# Patient Record
Sex: Female | Born: 1965 | Race: White | Hispanic: No | Marital: Married | Smoking: Never smoker
Health system: Southern US, Community
[De-identification: ages and names within clinical notes are randomized; demographics above are authoritative.]

## PROBLEM LIST (undated history)

## (undated) DIAGNOSIS — E7800 Pure hypercholesterolemia, unspecified: Secondary | ICD-10-CM

## (undated) DIAGNOSIS — I10 Essential (primary) hypertension: Secondary | ICD-10-CM

## (undated) DIAGNOSIS — G4733 Obstructive sleep apnea (adult) (pediatric): Secondary | ICD-10-CM

## (undated) DIAGNOSIS — Z860100 Personal history of colon polyps, unspecified: Secondary | ICD-10-CM

## (undated) DIAGNOSIS — R002 Palpitations: Secondary | ICD-10-CM

## (undated) DIAGNOSIS — Z9889 Other specified postprocedural states: Secondary | ICD-10-CM

## (undated) DIAGNOSIS — R32 Unspecified urinary incontinence: Secondary | ICD-10-CM

## (undated) DIAGNOSIS — R200 Anesthesia of skin: Secondary | ICD-10-CM

## (undated) DIAGNOSIS — R112 Nausea with vomiting, unspecified: Secondary | ICD-10-CM

## (undated) DIAGNOSIS — M1990 Unspecified osteoarthritis, unspecified site: Secondary | ICD-10-CM

## (undated) DIAGNOSIS — D649 Anemia, unspecified: Secondary | ICD-10-CM

## (undated) DIAGNOSIS — K8010 Calculus of gallbladder with chronic cholecystitis without obstruction: Secondary | ICD-10-CM

## (undated) HISTORY — DX: Unspecified urinary incontinence: R32

## (undated) HISTORY — DX: Calculus of gallbladder with chronic cholecystitis without obstruction: K80.10

## (undated) HISTORY — DX: Obstructive sleep apnea (adult) (pediatric): G47.33

## (undated) HISTORY — PX: EYE SURGERY: SHX253

## (undated) HISTORY — DX: Personal history of colon polyps, unspecified: Z86.0100

## (undated) HISTORY — DX: Palpitations: R00.2

## (undated) HISTORY — DX: Pure hypercholesterolemia, unspecified: E78.00

## (undated) HISTORY — PX: TRANSTHORACIC ECHOCARDIOGRAM: SHX275

## (undated) HISTORY — PX: DOBUTAMINE STRESS ECHO: SHX5426

## (undated) HISTORY — PX: OTHER SURGICAL HISTORY: SHX169

## (undated) HISTORY — PX: DILATION AND CURETTAGE OF UTERUS: SHX78

---

## 1999-05-31 ENCOUNTER — Other Ambulatory Visit: Admission: RE | Admit: 1999-05-31 | Discharge: 1999-05-31 | Payer: Self-pay | Admitting: Obstetrics & Gynecology

## 1999-10-05 ENCOUNTER — Other Ambulatory Visit: Admission: RE | Admit: 1999-10-05 | Discharge: 1999-10-05 | Payer: Self-pay | Admitting: Obstetrics & Gynecology

## 2000-05-16 ENCOUNTER — Other Ambulatory Visit: Admission: RE | Admit: 2000-05-16 | Discharge: 2000-05-16 | Payer: Self-pay | Admitting: Family Medicine

## 2000-08-07 ENCOUNTER — Other Ambulatory Visit: Admission: RE | Admit: 2000-08-07 | Discharge: 2000-08-07 | Payer: Self-pay | Admitting: Obstetrics & Gynecology

## 2000-08-22 ENCOUNTER — Encounter (INDEPENDENT_AMBULATORY_CARE_PROVIDER_SITE_OTHER): Payer: Self-pay | Admitting: Specialist

## 2000-08-22 ENCOUNTER — Other Ambulatory Visit: Admission: RE | Admit: 2000-08-22 | Discharge: 2000-08-22 | Payer: Self-pay | Admitting: Obstetrics & Gynecology

## 2000-11-28 ENCOUNTER — Other Ambulatory Visit: Admission: RE | Admit: 2000-11-28 | Discharge: 2000-11-28 | Payer: Self-pay | Admitting: Obstetrics & Gynecology

## 2000-11-29 ENCOUNTER — Other Ambulatory Visit: Admission: RE | Admit: 2000-11-29 | Discharge: 2000-11-29 | Payer: Self-pay | Admitting: Obstetrics & Gynecology

## 2000-11-29 ENCOUNTER — Encounter (INDEPENDENT_AMBULATORY_CARE_PROVIDER_SITE_OTHER): Payer: Self-pay | Admitting: Specialist

## 2001-02-27 ENCOUNTER — Other Ambulatory Visit: Admission: RE | Admit: 2001-02-27 | Discharge: 2001-02-27 | Payer: Self-pay | Admitting: Obstetrics & Gynecology

## 2001-09-03 ENCOUNTER — Other Ambulatory Visit: Admission: RE | Admit: 2001-09-03 | Discharge: 2001-09-03 | Payer: Self-pay | Admitting: Obstetrics & Gynecology

## 2002-01-21 ENCOUNTER — Other Ambulatory Visit: Admission: RE | Admit: 2002-01-21 | Discharge: 2002-01-21 | Payer: Self-pay | Admitting: Obstetrics & Gynecology

## 2002-09-18 ENCOUNTER — Other Ambulatory Visit: Admission: RE | Admit: 2002-09-18 | Discharge: 2002-09-18 | Payer: Self-pay | Admitting: Obstetrics & Gynecology

## 2003-02-27 ENCOUNTER — Other Ambulatory Visit: Admission: RE | Admit: 2003-02-27 | Discharge: 2003-02-27 | Payer: Self-pay | Admitting: Obstetrics & Gynecology

## 2003-10-06 ENCOUNTER — Other Ambulatory Visit: Admission: RE | Admit: 2003-10-06 | Discharge: 2003-10-06 | Payer: Self-pay | Admitting: Obstetrics & Gynecology

## 2004-11-26 ENCOUNTER — Other Ambulatory Visit: Admission: RE | Admit: 2004-11-26 | Discharge: 2004-11-26 | Payer: Self-pay | Admitting: Obstetrics & Gynecology

## 2005-01-18 ENCOUNTER — Emergency Department (HOSPITAL_COMMUNITY): Admission: EM | Admit: 2005-01-18 | Discharge: 2005-01-18 | Payer: Self-pay | Admitting: *Deleted

## 2005-12-23 ENCOUNTER — Other Ambulatory Visit: Admission: RE | Admit: 2005-12-23 | Discharge: 2005-12-23 | Payer: Self-pay | Admitting: Obstetrics & Gynecology

## 2007-12-01 ENCOUNTER — Emergency Department (HOSPITAL_COMMUNITY): Admission: EM | Admit: 2007-12-01 | Discharge: 2007-12-01 | Payer: Self-pay | Admitting: Emergency Medicine

## 2008-09-22 ENCOUNTER — Encounter: Admission: RE | Admit: 2008-09-22 | Discharge: 2008-09-22 | Payer: Self-pay | Admitting: Family Medicine

## 2010-12-17 ENCOUNTER — Other Ambulatory Visit (HOSPITAL_BASED_OUTPATIENT_CLINIC_OR_DEPARTMENT_OTHER): Payer: Self-pay | Admitting: Family Medicine

## 2010-12-17 DIAGNOSIS — R1900 Intra-abdominal and pelvic swelling, mass and lump, unspecified site: Secondary | ICD-10-CM

## 2010-12-24 ENCOUNTER — Ambulatory Visit (HOSPITAL_BASED_OUTPATIENT_CLINIC_OR_DEPARTMENT_OTHER)
Admission: RE | Admit: 2010-12-24 | Discharge: 2010-12-24 | Disposition: A | Discharge status: Home / Self Care | Payer: BC Managed Care – PPO | Source: Ambulatory Visit | Attending: Family Medicine | Admitting: Family Medicine

## 2010-12-24 DIAGNOSIS — R1900 Intra-abdominal and pelvic swelling, mass and lump, unspecified site: Secondary | ICD-10-CM

## 2010-12-24 DIAGNOSIS — R1902 Left upper quadrant abdominal swelling, mass and lump: Secondary | ICD-10-CM | POA: Insufficient documentation

## 2010-12-24 DIAGNOSIS — R229 Localized swelling, mass and lump, unspecified: Secondary | ICD-10-CM

## 2010-12-25 ENCOUNTER — Encounter: Payer: Self-pay | Admitting: Family Medicine

## 2011-08-11 LAB — RAPID STREP SCREEN (MED CTR MEBANE ONLY): Streptococcus, Group A Screen (Direct): NEGATIVE

## 2012-06-04 ENCOUNTER — Observation Stay (HOSPITAL_COMMUNITY)
Admission: EM | Admit: 2012-06-04 | Discharge: 2012-06-04 | Disposition: A | Discharge status: Home / Self Care | Payer: BC Managed Care – PPO | Attending: Emergency Medicine | Admitting: Emergency Medicine

## 2012-06-04 ENCOUNTER — Encounter (HOSPITAL_COMMUNITY): Payer: Self-pay | Admitting: Emergency Medicine

## 2012-06-04 ENCOUNTER — Emergency Department (HOSPITAL_COMMUNITY): Payer: BC Managed Care – PPO

## 2012-06-04 DIAGNOSIS — R072 Precordial pain: Secondary | ICD-10-CM

## 2012-06-04 DIAGNOSIS — Z79899 Other long term (current) drug therapy: Secondary | ICD-10-CM | POA: Insufficient documentation

## 2012-06-04 DIAGNOSIS — Z8249 Family history of ischemic heart disease and other diseases of the circulatory system: Secondary | ICD-10-CM | POA: Insufficient documentation

## 2012-06-04 DIAGNOSIS — R0602 Shortness of breath: Secondary | ICD-10-CM | POA: Insufficient documentation

## 2012-06-04 DIAGNOSIS — I10 Essential (primary) hypertension: Secondary | ICD-10-CM | POA: Insufficient documentation

## 2012-06-04 DIAGNOSIS — R079 Chest pain, unspecified: Principal | ICD-10-CM | POA: Insufficient documentation

## 2012-06-04 HISTORY — DX: Essential (primary) hypertension: I10

## 2012-06-04 LAB — BASIC METABOLIC PANEL
CO2: 23 mEq/L (ref 19–32)
Calcium: 9.4 mg/dL (ref 8.4–10.5)
Creatinine, Ser: 0.85 mg/dL (ref 0.50–1.10)
GFR calc Af Amer: >90 mL/min (ref >90)
GFR calc non Af Amer: 82 mL/min — ABNORMAL LOW (ref >90)

## 2012-06-04 LAB — POCT I-STAT TROPONIN I: Troponin i, poc: 0 ng/mL (ref 0.00–0.08)

## 2012-06-04 LAB — CBC WITH DIFFERENTIAL/PLATELET
Basophils Relative: 0 % (ref 0–1)
Eosinophils Absolute: 0.1 10*3/uL (ref 0.0–0.7)
HCT: 36.9 % (ref 36.0–46.0)
Hemoglobin: 12.1 g/dL (ref 12.0–15.0)
MCHC: 32.8 g/dL (ref 30.0–36.0)
MCV: 88.9 fL (ref 78.0–100.0)
Monocytes Absolute: 0.4 10*3/uL (ref 0.1–1.0)
RBC: 4.15 MIL/uL (ref 3.87–5.11)
WBC: 9.7 10*3/uL (ref 4.0–10.5)

## 2012-06-04 LAB — D-DIMER, QUANTITATIVE: D-Dimer, Quant: 1.36 ug/mL-FEU — ABNORMAL HIGH (ref 0.00–0.48)

## 2012-06-04 MED ORDER — IOHEXOL 350 MG/ML SOLN
100.0000 mL | Freq: Once | INTRAVENOUS | Status: AC | PRN
  Administered 2012-06-04: 100 mL via INTRAVENOUS

## 2012-06-04 MED ORDER — SODIUM CHLORIDE 0.45 % IV SOLN
INTRAVENOUS | Status: DC
  Administered 2012-06-04: 07:00:00 via INTRAVENOUS

## 2012-06-04 NOTE — Progress Notes (Signed)
*  PRELIMINARY RESULTS* Echocardiogram Echocardiogram Stress Test has been performed.  Jeryl Columbia 06/04/2012, 9:19 AM

## 2012-06-04 NOTE — ED Provider Notes (Signed)
History     CSN: [id number]  Arrival date & time 06/04/12  0145   First MD Initiated Contact with Patient 06/04/12 [phone]      Chief Complaint  Patient presents with  . Chest Pain     HPI PT. ARRIVED WITH EMS FROM HOME WOKE UP THIS MORNING WITH LEFT CHEST PAIN , NON RADIATING , SLIGHT SOB WITH OCCASIONAL DRY COUGH , NO NAUSEA OR DIAPHORESIS , PT. RECEIVED 4 BABY ASA AND 2 NTG SL WITH RELIEF. PT. STATES FAMILY HISTORY ( FATHER) OF HEART DISEASE.  Patient currently on birth control pills.  Past Medical History  Diagnosis Date  . Hypertension     Past Surgical History  Procedure Date  . Cesarean section     No family history on file.  History  Substance Use Topics  . Smoking status: Never Smoker   . Smokeless tobacco: Not on file  . Alcohol Use: No    OB History    Grav Para Term Preterm Abortions TAB SAB Ect Mult Living                  Review of Systems  All other systems reviewed and are negative.    Allergies  Review of patient's allergies indicates no known allergies.  Home Medications   Current Outpatient Rx  Name Route Sig Dispense Refill  . ASPIRIN EC 81 MG PO TBEC Oral Take 81 mg by mouth daily.    [name] Kitchen LISINOPRIL 20 MG PO TABS Oral Take 20 mg by mouth daily.    [name] Kitchen PRESCRIPTION MEDICATION Oral Take 1 tablet by mouth daily. Birth control    . ASPIRIN 81 MG PO CHEW Oral Chew 324 mg by mouth daily.    [name] Kitchen NITROGLYCERIN 0.4 MG SL SUBL Sublingual Place 0.4 mg under the tongue every 5 (five) minutes as needed. For chest pain-2 doses given in ambulance      BP 123/78  Pulse 82  Temp 97.7 F (36.5 C) (Oral)  Resp 14  SpO2 99%  LMP 05/07/2012  Physical Exam  Nursing note and vitals reviewed. Constitutional: She is oriented to person, place, and time. She appears well-developed and well-nourished. No distress.  HENT:  Head: Normocephalic and atraumatic.  Eyes: Pupils are equal, round, and reactive to light.  Neck: Normal range of motion.    Cardiovascular: Normal rate and intact distal pulses.         Date: 06/04/2012  Rate: 80  Rhythm: normal sinus rhythm  QRS Axis: normal  Intervals: normal  ST/T Wave abnormalities: normal  Conduction Disutrbances: none  Narrative Interpretation: unremarkable      Pulmonary/Chest: No respiratory distress.  Abdominal: Normal appearance. She exhibits no distension. There is no tenderness. There is no rebound.  Musculoskeletal: Normal range of motion.  Neurological: She is alert and oriented to person, place, and time. No cranial nerve deficit.  Skin: Skin is warm and dry. No rash noted.  Psychiatric: She has a normal mood and affect. Her behavior is normal.    ED Course  Procedures (including critical care time)  Labs Reviewed  BASIC METABOLIC PANEL - Abnormal; Notable for the following:    Glucose, Bld 115 (*)     GFR calc non Af Amer 82 (*)     All other components within normal limits  D-DIMER, QUANTITATIVE - Abnormal; Notable for the following:    D-Dimer, Quant 1.36 (*)     All other components within normal limits  CBC WITH DIFFERENTIAL  POCT  I-STAT TROPONIN I  POCT I-STAT TROPONIN I   Dg Chest 2 View  06/04/2012  *RADIOLOGY REPORT*  Clinical Data: Chest pain under the left breast.  History of high blood pressure.  CHEST - 2 VIEW  Comparison: None.  Findings: Technically limited study due to motion artifact. Shallow inspiration.  Normal heart size and pulmonary vascularity for technique.  No focal airspace consolidation in the lungs.  No blunting of costophrenic angles.  No pneumothorax.  IMPRESSION: No evidence of active pulmonary disease.  Original Report Authenticated By: Marlon Pel, M.D.   Ct Angio Chest W/cm &/or Wo Cm  06/04/2012  *RADIOLOGY REPORT*  Clinical Data: Sudden onset of shortness of breath.  Chest pain.  CT ANGIOGRAPHY CHEST  Technique:  Multidetector CT imaging of the chest using the standard protocol during bolus administration of intravenous  contrast. Multiplanar reconstructed images including MIPs were obtained and reviewed to evaluate the vascular anatomy.  Contrast: OMNIPAQUE IOHEXOL 350 MG/ML SOLN  Comparison: None.  Findings: Technically limited study due to limited contrast opacification of the pulmonary arteries.  No definite large central pulmonary embolus is identified, but emboli cannot be excluded due to technical factors.  Normal caliber thoracic aorta.  Normal heart size.  Visualized upper abdominal organs are unremarkable.  No significant lymphadenopathy in the chest.  No pleural effusions. Visualization of the lungs is limited due to respiratory motion artifact.  There is focal atelectasis or scarring in the right lung base.  No pneumothorax.  No focal consolidation.  Airways appear grossly patent.  IMPRESSION: Technically limited study with poor contrast opacification of the pulmonary arteries.  Although no emboli are visible, the emboli cannot be excluded due to technical factors.  No focal consolidation.  Original Report Authenticated By: Marlon Pel, M.D.     1. Chest pain       MDM  Plan: We'll move to CDU under chest pain protocol and obtain stress echo.        Nelia Shi, MD 06/04/12 [phone]

## 2012-06-04 NOTE — ED Notes (Signed)
Family at bedside. 

## 2012-06-04 NOTE — ED Notes (Signed)
Pt education provided on stress test. Questions answered

## 2012-06-04 NOTE — ED Notes (Signed)
PT. ARRIVED WITH EMS FROM HOME WOKE UP THIS MORNING WITH LEFT CHEST PAIN , NON RADIATING , SLIGHT SOB WITH OCCASIONAL DRY COUGH , NO NAUSEA OR DIAPHORESIS , PT. RECEIVED 4 BABY ASA AND 2 NTG SL WITH RELIEF. PT. STATES FAMILY HISTORY ( FATHER) OF HEART DISEASE.

## 2012-06-04 NOTE — ED Provider Notes (Signed)
Repeat EKG unchanged from initial EKG.  Nelia Shi, MD 06/04/12 [phone]

## 2012-06-04 NOTE — ED Notes (Signed)
DR. Radford Pax SPOKE WITH PT. ON PLAN OF CARE AND INITIAL RESULTS OF TEST.

## 2012-06-04 NOTE — ED Provider Notes (Signed)
0700 Report received from Dr. Radford Pax.  Patient with pain below L breast.  Negative CT angio.  Chest x-ray  Unremarkable. Labs unremarkable except elevated d-dimer.  Waiting for stress echo.   1000 Patient is pain-free. 2-D echo is normal. Patient will followup with her PCP this week. In the meantime she'll try ibuprofen for her pain. Return for severe pain or shortness of breath. Labs Reviewed  BASIC METABOLIC PANEL - Abnormal; Notable for the following:    Glucose, Bld 115 (*)     GFR calc non Af Amer 82 (*)     All other components within normal limits  D-DIMER, QUANTITATIVE - Abnormal; Notable for the following:    D-Dimer, Quant 1.36 (*)     All other components within normal limits  CBC WITH DIFFERENTIAL  POCT I-STAT TROPONIN I  POCT I-STAT TROPONIN I    Remi Haggard, NP 06/04/12 1018

## 2012-06-05 ENCOUNTER — Other Ambulatory Visit: Payer: Self-pay | Admitting: Family Medicine

## 2012-06-05 DIAGNOSIS — R079 Chest pain, unspecified: Secondary | ICD-10-CM

## 2012-06-08 ENCOUNTER — Other Ambulatory Visit: Payer: BC Managed Care – PPO

## 2012-06-13 NOTE — Progress Notes (Signed)
Observation review is complete for 06/04/2012 visit.

## 2012-06-14 ENCOUNTER — Ambulatory Visit
Admission: RE | Admit: 2012-06-14 | Discharge: 2012-06-14 | Disposition: A | Discharge status: Home / Self Care | Payer: BC Managed Care – PPO | Source: Ambulatory Visit | Attending: Family Medicine | Admitting: Family Medicine

## 2012-06-14 DIAGNOSIS — R079 Chest pain, unspecified: Secondary | ICD-10-CM

## 2012-08-29 ENCOUNTER — Other Ambulatory Visit: Payer: Self-pay | Admitting: Obstetrics & Gynecology

## 2012-08-29 DIAGNOSIS — N6320 Unspecified lump in the left breast, unspecified quadrant: Secondary | ICD-10-CM

## 2012-09-05 ENCOUNTER — Ambulatory Visit
Admission: RE | Admit: 2012-09-05 | Discharge: 2012-09-05 | Disposition: A | Discharge status: Home / Self Care | Payer: BC Managed Care – PPO | Source: Ambulatory Visit | Attending: Obstetrics & Gynecology | Admitting: Obstetrics & Gynecology

## 2012-09-05 DIAGNOSIS — N6320 Unspecified lump in the left breast, unspecified quadrant: Secondary | ICD-10-CM

## 2012-10-12 ENCOUNTER — Emergency Department (HOSPITAL_COMMUNITY)
Admission: EM | Admit: 2012-10-12 | Discharge: 2012-10-12 | Disposition: A | Discharge status: Home / Self Care | Payer: BC Managed Care – PPO | Attending: Emergency Medicine | Admitting: Emergency Medicine

## 2012-10-12 ENCOUNTER — Encounter: Payer: Self-pay | Admitting: Gastroenterology

## 2012-10-12 ENCOUNTER — Emergency Department (HOSPITAL_COMMUNITY): Payer: BC Managed Care – PPO

## 2012-10-12 ENCOUNTER — Encounter (HOSPITAL_COMMUNITY): Payer: Self-pay | Admitting: Family Medicine

## 2012-10-12 DIAGNOSIS — Z7982 Long term (current) use of aspirin: Secondary | ICD-10-CM | POA: Insufficient documentation

## 2012-10-12 DIAGNOSIS — I10 Essential (primary) hypertension: Secondary | ICD-10-CM | POA: Insufficient documentation

## 2012-10-12 DIAGNOSIS — R079 Chest pain, unspecified: Secondary | ICD-10-CM | POA: Insufficient documentation

## 2012-10-12 LAB — COMPREHENSIVE METABOLIC PANEL
ALT: 41 U/L — ABNORMAL HIGH (ref 0–35)
AST: 72 U/L — ABNORMAL HIGH (ref 0–37)
Calcium: 9.2 mg/dL (ref 8.4–10.5)
Creatinine, Ser: 0.9 mg/dL (ref 0.50–1.10)
GFR calc Af Amer: 88 mL/min — ABNORMAL LOW (ref >90)
Glucose, Bld: 166 mg/dL — ABNORMAL HIGH (ref 70–99)
Sodium: 133 mEq/L — ABNORMAL LOW (ref 135–145)
Total Protein: 6.9 g/dL (ref 6.0–8.3)

## 2012-10-12 LAB — CBC WITH DIFFERENTIAL/PLATELET
Basophils Relative: 0 % (ref 0–1)
Eosinophils Absolute: 0 10*3/uL (ref 0.0–0.7)
Eosinophils Relative: 0 % (ref 0–5)
MCH: 29.7 pg (ref 26.0–34.0)
MCHC: 33.1 g/dL (ref 30.0–36.0)
Neutrophils Relative %: 82 % — ABNORMAL HIGH (ref 43–77)
Platelets: 231 10*3/uL (ref 150–400)
RDW: 12.7 % (ref 11.5–15.5)

## 2012-10-12 LAB — POCT I-STAT TROPONIN I: Troponin i, poc: 0 ng/mL (ref 0.00–0.08)

## 2012-10-12 LAB — LIPASE, BLOOD: Lipase: 24 U/L (ref 11–59)

## 2012-10-12 MED ORDER — OMEPRAZOLE 20 MG PO CPDR: 20.0000 mg | DELAYED_RELEASE_CAPSULE | Freq: Every day | ORAL | Status: DC

## 2012-10-12 MED ORDER — HYDROCODONE-ACETAMINOPHEN 5-325 MG PO TABS: 1.0000 | ORAL_TABLET | ORAL | Status: DC | PRN

## 2012-10-12 MED ORDER — MORPHINE SULFATE 4 MG/ML IJ SOLN
4.0000 mg | Freq: Once | INTRAMUSCULAR | Status: AC
  Administered 2012-10-12: 4 mg via INTRAVENOUS
  Filled 2012-10-12: qty 1

## 2012-10-12 MED ORDER — NITROGLYCERIN 2 % TD OINT
1.0000 [in_us] | TOPICAL_OINTMENT | Freq: Four times a day (QID) | TRANSDERMAL | Status: DC
  Administered 2012-10-12: 1 [in_us] via TOPICAL
  Filled 2012-10-12: qty 1

## 2012-10-12 MED ORDER — PANTOPRAZOLE SODIUM 40 MG IV SOLR
40.0000 mg | Freq: Once | INTRAVENOUS | Status: AC
  Administered 2012-10-12: 40 mg via INTRAVENOUS
  Filled 2012-10-12: qty 40

## 2012-10-12 MED ORDER — FAMOTIDINE IN NACL 20-0.9 MG/50ML-% IV SOLN
20.0000 mg | Freq: Once | INTRAVENOUS | Status: AC
  Administered 2012-10-12: 20 mg via INTRAVENOUS
  Filled 2012-10-12: qty 50

## 2012-10-12 MED ORDER — GI COCKTAIL ~~LOC~~
30.0000 mL | Freq: Once | ORAL | Status: AC
  Administered 2012-10-12: 30 mL via ORAL
  Filled 2012-10-12: qty 30

## 2012-10-12 MED ORDER — SUCRALFATE 1 G PO TABS: 1.0000 g | ORAL_TABLET | Freq: Four times a day (QID) | ORAL | Status: DC

## 2012-10-12 MED ORDER — SODIUM CHLORIDE 0.9 % IV SOLN
Freq: Once | INTRAVENOUS | Status: AC
Start: 2012-10-12 — End: 2012-10-12
  Administered 2012-10-12: 02:00:00 via INTRAVENOUS

## 2012-10-12 MED ORDER — ONDANSETRON HCL 8 MG PO TABS: 8.0000 mg | ORAL_TABLET | Freq: Three times a day (TID) | ORAL | Status: DC | PRN

## 2012-10-12 NOTE — ED Provider Notes (Signed)
Medical screening examination/treatment/procedure(s) were performed by non-physician practitioner and as supervising physician I was immediately available for consultation/collaboration.  Laren Whaling, MD 10/12/12 2315 

## 2012-10-12 NOTE — ED Provider Notes (Signed)
History     CSN: [id number]  Arrival date & time 10/12/12  0106   First MD Initiated Contact with Patient 10/12/12 0115      Chief Complaint  Patient presents with  . Chest Pain    (Consider location/radiation/quality/duration/timing/severity/associated sxs/prior treatment) HPI History provided by pt.   Pt developed pain in left upper chest at approx 7pm at rest this evening.  Pain gradually worsened and migrated down to left lower parasternal region. Describes as severe, constant, non-radiating pressure that is non-positional and non-exertional.  Associated w/ N/V but denies fever, cough, SOB, diarrhea and urinary sx.  No RF for PE and denies LE pain/edema.   Per prior chart, pt had a normal stress echo in 05/2012 when she presented w/ similar, but less severe pain.  No relief w/ SL ntg en route to hospital.    Past Medical History  Diagnosis Date  . Hypertension     Past Surgical History  Procedure Date  . Cesarean section     No family history on file.  History  Substance Use Topics  . Smoking status: Never Smoker   . Smokeless tobacco: Not on file  . Alcohol Use: No    OB History    Grav Para Term Preterm Abortions TAB SAB Ect Mult Living                  Review of Systems  All other systems reviewed and are negative.    Allergies  Review of patient's allergies indicates no known allergies.  Home Medications   Current Outpatient Rx  Name  Route  Sig  Dispense  Refill  . ASPIRIN EC 81 MG PO TBEC   Oral   Take 81 mg by mouth daily.         [name] Kitchen LISINOPRIL 20 MG PO TABS   Oral   Take 20 mg by mouth daily.           BP 129/86  Pulse 62  Temp 97.8 F (36.6 C) (Oral)  Resp 28  Ht 5\' 6"  (1.676 m)  Wt 280 lb (127.007 kg)  BMI 45.19 kg/m2  SpO2 98%  LMP 10/10/2012  Physical Exam  Nursing note and vitals reviewed. Constitutional: She is oriented to person, place, and time. She appears well-developed and well-nourished. No distress.       Pt  appears very uncomfortable and mildly anxious  HENT:  Head: Normocephalic and atraumatic.  Eyes:       Normal appearance  Neck: Normal range of motion.  Cardiovascular: Normal rate, regular rhythm and intact distal pulses.   Pulmonary/Chest: Effort normal and breath sounds normal. No respiratory distress. She exhibits no tenderness.       No pleuritic pain reported  Abdominal: Soft. Bowel sounds are normal. She exhibits no distension. There is no guarding.       Epigastric and LUQ ttp  Musculoskeletal: Normal range of motion.       No peripheral edema or calf tenderness  Neurological: She is alert and oriented to person, place, and time.  Skin: Skin is warm and dry. No rash noted.  Psychiatric: She has a normal mood and affect. Her behavior is normal.    ED Course  Procedures (including critical care time)  Date: 10/12/2012  Rate: 80  Rhythm: normal sinus rhythm  QRS Axis: normal  Intervals: normal  ST/T Wave abnormalities: normal  Conduction Disutrbances:none  Narrative Interpretation:   Old EKG Reviewed: unchanged   Labs Reviewed  CBC WITH DIFFERENTIAL - Abnormal; Notable for the following:    WBC 15.1 (*)     Neutrophils Relative 82 (*)     Neutro Abs 12.5 (*)     All other components within normal limits  COMPREHENSIVE METABOLIC PANEL - Abnormal; Notable for the following:    Sodium 133 (*)     Glucose, Bld 166 (*)     AST 72 (*)     ALT 41 (*)     GFR calc non Af Amer 76 (*)     GFR calc Af Amer 88 (*)     All other components within normal limits  LIPASE, BLOOD  POCT I-STAT TROPONIN I   Dg Chest 2 View  10/12/2012  *RADIOLOGY REPORT*  Clinical Data: Shortness of breath, chest pain, wheezing  CHEST - 2 VIEW  Comparison: 06/04/2012  Findings: Unchanged cardiomediastinal contours, within normal range.  Linear left lung base opacity.  Lungs otherwise clear.  No pleural effusion or pneumothorax.  No acute osseous finding.  IMPRESSION: Mild linear left lung base  opacity, likely atelectasis.   Original Report Authenticated By: Jearld Lesch, M.D.      1. Chest pain       MDM  [phone] F w/ h/o HTN, otherwise healthy, presents w/ severe, L sided CP since 7pm yesterday.  Associated w/ N/V only.   On exam, pt afebrile, VS w/in nml range, uncomfortable appearing, mild tenderness epigastrium, LUQ and left lower chest and no signs of DVT.  Pain atypical, Neg cardiac stress in 05/2012, EKG non-ischemic, troponin neg; doubt ACS.  No RF for PE. No significant lab findings and CXR neg.  Pt had no relief w/ SL ntg or GI cocktail but pain improved w/ morphine.  On re-peat exam, continues to have epigastric and LUQ "soreness" with palpation and VSS.  She is concerned that pain may be d/t gallbladder, but very unlikely based on location.  I suspect that she is experiencing acid reflux or esophageal spasm.  Discussed w/ patient.  She is frustrated that we can not give her a definite etiology, but I spent a Deahl time explaining all of the potentially life-threatening illnesses we have ruled out with exam, labs and imaging.  Will d/c home w/ prilosec, carafate, zofran and vicodin as well as referral to GI and back to her PCP.  Return precautions discussed.         Otilio Miu, [place] 10/12/12 [phone]

## 2012-10-12 NOTE — ED Notes (Signed)
MD at bedside. 

## 2012-10-12 NOTE — ED Notes (Signed)
IV team paged.  

## 2012-10-12 NOTE — ED Notes (Signed)
Pt presents with right-sided CP radiating to mid sternum that began at 1930 today.  Per EMS, pt vomited x1 PTA.  Pt was given 324mg  ASA at 0040 and 1SL Nitro at 0030 for 6/10 CP and pain was not alleviated.

## 2012-10-12 NOTE — ED Notes (Signed)
Patient transported to X-ray 

## 2012-10-12 NOTE — ED Notes (Signed)
EDP at bedside  

## 2012-10-12 NOTE — ED Notes (Signed)
Dr. Lavella Lemons made aware of pts pressure; IV being started at this time. RN at bedside.

## 2012-10-12 NOTE — Telephone Encounter (Signed)
Error

## 2012-10-15 ENCOUNTER — Encounter: Payer: Self-pay | Admitting: Gastroenterology

## 2012-10-15 ENCOUNTER — Ambulatory Visit (INDEPENDENT_AMBULATORY_CARE_PROVIDER_SITE_OTHER): Payer: BC Managed Care – PPO | Admitting: Gastroenterology

## 2012-10-15 VITALS — BP 120/88 | HR 60 | Ht 66.0 in | Wt 282.4 lb

## 2012-10-15 DIAGNOSIS — R079 Chest pain, unspecified: Secondary | ICD-10-CM

## 2012-10-15 NOTE — Patient Instructions (Addendum)
You have been scheduled for an abdominal ultrasound at [facility] (1st floor of hospital) on 10/17/12 at 9:30am. Please arrive 15 minutes prior to your appointment for registration. Make certain not to have anything to eat or drink 6 hours prior to your appointment. Should you need to reschedule your appointment, please contact radiology at [phone]. This test typically takes about 30 minutes to perform.  cc: Johna Sheriff, PA

## 2012-10-15 NOTE — Progress Notes (Signed)
History of Present Illness: This is a 46 year old female who was recently seen in [facility] emergency department for the sudden onset of severe chest pain. Her husband accompanies her today. She also had epigastric tenderness and left upper quadrant tenderness noted as well. She had nausea and vomiting. Evaluation in the emergency department showed several abnormal blood tests with a white blood cell count 15.1, sodium 133, glucose 166, AST 72, ALT 41. Her pain was not relieved with nitroglycerin or a GI cocktail that was relieved by morphine. She was prescribed a PPI, Carafate Vicodin and Zofran. She had an barium esophagram and chest CT scan performed in July for a similar episode of acute chest pain and those exams were unremarkable. Denies weight loss, abdominal pain, constipation, diarrhea, change in stool caliber, melena, hematochezia, nausea, vomiting, dysphagia, reflux symptoms.  Review of Systems: Pertinent positive and negative review of systems were noted in the above HPI section. All other review of systems were otherwise negative.  Current Medications, Allergies, Past Medical History, Past Surgical History, Family History and Social History were reviewed in [employer] record.  Physical Exam: General: Well developed , well nourished, obese, no acute distress Head: Normocephalic and atraumatic Eyes:  sclerae anicteric, EOMI Ears: Normal auditory acuity Mouth: No deformity or lesions Neck: Supple, no masses or thyromegaly Lungs: Clear throughout to auscultation, no chest wall tenderness Heart: Regular rate and rhythm; no murmurs, rubs or bruits Abdomen: Soft, non tender and non distended. No masses, hepatosplenomegaly or hernias noted. Normal Bowel sounds Musculoskeletal: Symmetrical with no gross deformities  Skin: No lesions on visible extremities Pulses:  Normal pulses noted Extremities: No clubbing, cyanosis, edema or deformities noted Neurological: Alert  oriented x 4, grossly nonfocal Cervical Nodes:  No significant cervical adenopathy Inguinal Nodes: No significant inguinal adenopathy Psychological:  Alert and cooperative. Normal mood and affect  Assessment and Recommendations:  1. Acute chest pain associated with epigastric and left upper quadrant pain. Possible biliary disease but if her biliary evaluation is negative her symptoms would not be consistent with a gastrointestinal disorder. I've advised her to return to her PCP for further evaluation if biliary disease is excluded. Schedule abdominal ultrasound.  2. Minimally abnormal LFTs. Further evaluation with abdominal ultrasound. Possible fatty liver.

## 2012-10-17 ENCOUNTER — Ambulatory Visit (HOSPITAL_COMMUNITY)
Admission: RE | Admit: 2012-10-17 | Discharge: 2012-10-17 | Disposition: A | Discharge status: Home / Self Care | Payer: BC Managed Care – PPO | Source: Ambulatory Visit | Attending: Gastroenterology | Admitting: Gastroenterology

## 2012-10-17 DIAGNOSIS — R7989 Other specified abnormal findings of blood chemistry: Secondary | ICD-10-CM | POA: Insufficient documentation

## 2012-10-17 DIAGNOSIS — K8020 Calculus of gallbladder without cholecystitis without obstruction: Secondary | ICD-10-CM | POA: Insufficient documentation

## 2012-10-17 DIAGNOSIS — R109 Unspecified abdominal pain: Secondary | ICD-10-CM | POA: Insufficient documentation

## 2012-10-17 DIAGNOSIS — R7402 Elevation of levels of lactic acid dehydrogenase (LDH): Secondary | ICD-10-CM | POA: Insufficient documentation

## 2012-10-17 DIAGNOSIS — R7401 Elevation of levels of liver transaminase levels: Secondary | ICD-10-CM | POA: Insufficient documentation

## 2012-10-17 DIAGNOSIS — R079 Chest pain, unspecified: Secondary | ICD-10-CM | POA: Insufficient documentation

## 2012-10-22 ENCOUNTER — Other Ambulatory Visit: Payer: Self-pay

## 2012-10-22 DIAGNOSIS — K8020 Calculus of gallbladder without cholecystitis without obstruction: Secondary | ICD-10-CM

## 2012-10-29 ENCOUNTER — Encounter (INDEPENDENT_AMBULATORY_CARE_PROVIDER_SITE_OTHER): Payer: Self-pay | Admitting: Surgery

## 2012-10-29 ENCOUNTER — Ambulatory Visit (INDEPENDENT_AMBULATORY_CARE_PROVIDER_SITE_OTHER): Payer: BC Managed Care – PPO | Admitting: Surgery

## 2012-10-29 VITALS — BP 132/80 | HR 81 | Temp 97.4°F | Ht 66.0 in | Wt 281.0 lb

## 2012-10-29 DIAGNOSIS — K8010 Calculus of gallbladder with chronic cholecystitis without obstruction: Secondary | ICD-10-CM

## 2012-10-29 HISTORY — DX: Calculus of gallbladder with chronic cholecystitis without obstruction: K80.10

## 2012-10-29 NOTE — Progress Notes (Signed)
Patient ID: Marissa Marks, female   DOB: 06/20/1966, 46 y.o.   MRN: [id number]  Chief Complaint  Patient presents with  . Pre-op Exam    eval gallbladder with stones    HPI Marissa Marks is a 46 y.o. female.  Referred by Dr. Malcolm Stark for evaluation of gallbladder disease. HPI This is a 46 year old female who has had two recent ER visits for epigastric pain radiating up into her left chest, into her back, and around her left side.  Cardiac disease and pulmonary embolism were ruled out.  She has had a normal contrast swallow.  She was referred to Dr. Stark for evaluation, and he ordered a RUQ ultrasound.  This showed gallstones with no sign of cholecystitis.  She did have some nausea/ vomiting, and some post-prandial diarrhea. She is now referred for surgical evaluation.   The first episode occurred after eating [employer] takeout and the second episode occurred after eating at [employer].  Past Medical History  Diagnosis Date  . Hypertension     Past Surgical History  Procedure Date  . Cesarean section 11/10/86 09/06/88    x2    Family History  Problem Relation Age of Onset  . Heart disease Father   . Heart disease Maternal Grandmother   . Heart disease Maternal Grandfather   . Heart disease Paternal Grandmother   . Ovarian cancer Paternal Grandmother   . Heart disease Paternal Grandfather   . Parkinson's disease Mother     Social History History  Substance Use Topics  . Smoking status: Never Smoker   . Smokeless tobacco: Never Used  . Alcohol Use: No    No Known Allergies  Current Outpatient Prescriptions  Medication Sig Dispense Refill  . aspirin EC 81 MG tablet Take 81 mg by mouth daily.      . lisinopril (PRINIVIL,ZESTRIL) 20 MG tablet Take 20 mg by mouth daily.      . PRESCRIPTION MEDICATION Patient has an IUD.      . HYDROcodone-acetaminophen (NORCO/VICODIN) 5-325 MG per tablet Take 1 tablet by mouth every 4 (four) hours as needed for pain.  20 tablet  0     Review of Systems Review of Systems  Constitutional: Negative for fever, chills and unexpected weight change.  HENT: Negative for hearing loss, congestion, sore throat, trouble swallowing and voice change.   Eyes: Negative for visual disturbance.  Respiratory: Negative for cough and wheezing.   Cardiovascular: Negative for chest pain, palpitations and leg swelling.  Gastrointestinal: Positive for nausea, vomiting, abdominal pain and diarrhea. Negative for constipation, blood in stool, abdominal distention and anal bleeding.  Genitourinary: Negative for hematuria, vaginal bleeding and difficulty urinating.  Musculoskeletal: Negative for arthralgias.  Skin: Negative for rash and wound.  Neurological: Negative for seizures, syncope and headaches.  Hematological: Negative for adenopathy. Does not bruise/bleed easily.  Psychiatric/Behavioral: Negative for confusion.    Blood pressure 132/80, pulse 81, temperature 97.4 F (36.3 C), temperature source Temporal, height 5' 6" (1.676 m), weight 281 lb (127.461 kg), last menstrual period 10/10/2012, SpO2 97.00%.  Physical Exam Physical Exam WDWN in NAD HEENT:  EOMI, sclera anicteric Neck:  No masses, no thyromegaly Lungs:  CTA bilaterally; normal respiratory effort CV:  Regular rate and rhythm; no murmurs Abd:  +bowel sounds, soft, non-tender, no masses; no RUQ or epigastric tenderness Ext:  Well-perfused; no edema Skin:  Warm, dry; no sign of jaundice  Data Reviewed     *RADIOLOGY REPORT*  Clinical Data: Epigastric pain,   elevated LDH  COMPLETE ABDOMINAL ULTRASOUND  Comparison: None.  Findings:  Gallbladder: Multiple gallstones are noted filling the  gallbladder. No thickening of the gallbladder wall. No  sonographic Murphy's sign.  Common bile duct: Measures 4.4 mm in diameter within normal  limits.  Liver: No focal lesion identified. Within normal limits in  parenchymal echogenicity.  IVC: Appears normal.  Pancreas: No  focal abnormality seen.  Spleen: Measures 7.3 cm in length. Normal echogenicity.  Right Kidney: Measures 12 cm in length. No mass, hydronephrosis  or diagnostic renal calculus  Left Kidney: Measures 11.8 cm in length. No mass, hydronephrosis  or diagnostic renal calculi  Abdominal aorta: No aneurysm identified. Measures up to 2.2 cm in  diameter.  IMPRESSION:  1. Multiple gallstones are noted filling the gallbladder. No  thickening of gallbladder wall. No sonographic Murphy's sign.  2. Normal CBD.  3. No hydronephrosis or diagnostic renal calculus.  Original Report Authenticated By: Liviu Pop, M.D.   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  Most of her tenderness is epigastric going out to her left side, but these attacks are post-prandial with nausea, vomiting, and diarrhea.      Plan    Recommend laparoscopic cholecystectomy with intraoperative cholangiogram.  The surgical procedure has been discussed with the patient.  Potential risks, benefits, alternative treatments, and expected outcomes have been explained.  All of the patient's questions at this time have been answered.  The likelihood of reaching the patient's treatment goal is good.  The patient understand the proposed surgical procedure and wishes to proceed.        Kashon Kraynak K. 10/29/2012, 5:13 PM    

## 2012-11-02 ENCOUNTER — Encounter (HOSPITAL_BASED_OUTPATIENT_CLINIC_OR_DEPARTMENT_OTHER): Payer: Self-pay | Admitting: *Deleted

## 2012-11-02 ENCOUNTER — Telehealth (INDEPENDENT_AMBULATORY_CARE_PROVIDER_SITE_OTHER): Payer: Self-pay | Admitting: General Surgery

## 2012-11-02 NOTE — Progress Notes (Signed)
Pt still having nausea-not as much pain Constipated-going to get stool softners Labs and ekg,cxr done 11/13

## 2012-11-02 NOTE — Telephone Encounter (Signed)
Pt called for advice about constipation before her surgery (lap chole) next week.  Recommended she try stool softener, Miralax, walking and drinking fruit juice.  Can try MOM or Fleets enema if prefers.  Suggested she continue taking the stool softener to keep her bowel movements soft post operatively.  Reminded pt that pain medicine is constipating, so preemptively treating with stool softener and Miralax is a good idea.  Pt questions answered and she understands instructions.

## 2012-11-06 ENCOUNTER — Encounter (HOSPITAL_BASED_OUTPATIENT_CLINIC_OR_DEPARTMENT_OTHER)
Admission: RE | Disposition: A | Discharge status: Home / Self Care | Payer: Self-pay | Source: Ambulatory Visit | Attending: Surgery

## 2012-11-06 ENCOUNTER — Encounter (HOSPITAL_BASED_OUTPATIENT_CLINIC_OR_DEPARTMENT_OTHER): Payer: Self-pay | Admitting: Anesthesiology

## 2012-11-06 ENCOUNTER — Encounter (HOSPITAL_BASED_OUTPATIENT_CLINIC_OR_DEPARTMENT_OTHER): Payer: Self-pay | Admitting: *Deleted

## 2012-11-06 ENCOUNTER — Ambulatory Visit (HOSPITAL_COMMUNITY): Payer: BC Managed Care – PPO

## 2012-11-06 ENCOUNTER — Ambulatory Visit (HOSPITAL_BASED_OUTPATIENT_CLINIC_OR_DEPARTMENT_OTHER): Payer: BC Managed Care – PPO | Admitting: Anesthesiology

## 2012-11-06 ENCOUNTER — Ambulatory Visit (HOSPITAL_BASED_OUTPATIENT_CLINIC_OR_DEPARTMENT_OTHER)
Admission: RE | Admit: 2012-11-06 | Discharge: 2012-11-06 | Disposition: A | Discharge status: Home / Self Care | Payer: BC Managed Care – PPO | Source: Ambulatory Visit | Attending: Surgery | Admitting: Surgery

## 2012-11-06 DIAGNOSIS — Z7982 Long term (current) use of aspirin: Secondary | ICD-10-CM | POA: Insufficient documentation

## 2012-11-06 DIAGNOSIS — K8010 Calculus of gallbladder with chronic cholecystitis without obstruction: Secondary | ICD-10-CM | POA: Insufficient documentation

## 2012-11-06 DIAGNOSIS — K824 Cholesterolosis of gallbladder: Secondary | ICD-10-CM

## 2012-11-06 DIAGNOSIS — I10 Essential (primary) hypertension: Secondary | ICD-10-CM | POA: Insufficient documentation

## 2012-11-06 DIAGNOSIS — Z8249 Family history of ischemic heart disease and other diseases of the circulatory system: Secondary | ICD-10-CM | POA: Insufficient documentation

## 2012-11-06 DIAGNOSIS — Z8041 Family history of malignant neoplasm of ovary: Secondary | ICD-10-CM | POA: Insufficient documentation

## 2012-11-06 HISTORY — PX: CHOLECYSTECTOMY: SHX55

## 2012-11-06 SURGERY — LAPAROSCOPIC CHOLECYSTECTOMY WITH INTRAOPERATIVE CHOLANGIOGRAM
Anesthesia: General | Site: Abdomen | Wound class: Clean

## 2012-11-06 MED ORDER — METOCLOPRAMIDE HCL 5 MG/ML IJ SOLN: 10.0000 mg | Freq: Four times a day (QID) | INTRAMUSCULAR | Status: DC | PRN

## 2012-11-06 MED ORDER — BUPIVACAINE-EPINEPHRINE 0.25% -1:200000 IJ SOLN
INTRAMUSCULAR | Status: DC | PRN
  Administered 2012-11-06: 14 mL

## 2012-11-06 MED ORDER — EPHEDRINE SULFATE 50 MG/ML IJ SOLN
INTRAMUSCULAR | Status: DC | PRN
  Administered 2012-11-06: 10 mg via INTRAVENOUS

## 2012-11-06 MED ORDER — SODIUM CHLORIDE 0.9 % IR SOLN
Status: DC | PRN
  Administered 2012-11-06: 1

## 2012-11-06 MED ORDER — LIDOCAINE HCL (CARDIAC) 20 MG/ML IV SOLN
INTRAVENOUS | Status: DC | PRN
  Administered 2012-11-06: 70 mg via INTRAVENOUS

## 2012-11-06 MED ORDER — HYDROMORPHONE HCL PF 1 MG/ML IJ SOLN
0.2500 mg | INTRAMUSCULAR | Status: DC | PRN
  Administered 2012-11-06 (×2): 0.5 mg via INTRAVENOUS

## 2012-11-06 MED ORDER — PROPOFOL 10 MG/ML IV BOLUS
INTRAVENOUS | Status: DC | PRN
  Administered 2012-11-06: 50 mg via INTRAVENOUS
  Administered 2012-11-06: 200 mg via INTRAVENOUS

## 2012-11-06 MED ORDER — FENTANYL CITRATE 0.05 MG/ML IJ SOLN
INTRAMUSCULAR | Status: DC | PRN
  Administered 2012-11-06 (×2): 100 ug via INTRAVENOUS
  Administered 2012-11-06: 50 ug via INTRAVENOUS

## 2012-11-06 MED ORDER — ONDANSETRON HCL 4 MG/2ML IJ SOLN
INTRAMUSCULAR | Status: DC | PRN
  Administered 2012-11-06: 4 mg via INTRAVENOUS

## 2012-11-06 MED ORDER — LACTATED RINGERS IV SOLN
INTRAVENOUS | Status: DC
  Administered 2012-11-06: 12:00:00 via INTRAVENOUS
  Administered 2012-11-06: 10 mL/h via INTRAVENOUS
  Administered 2012-11-06: 13:00:00 via INTRAVENOUS
  Administered 2012-11-06: 10 mL/h via INTRAVENOUS

## 2012-11-06 MED ORDER — METOCLOPRAMIDE HCL 5 MG/ML IJ SOLN
10.0000 mg | Freq: Once | INTRAMUSCULAR | Status: AC
  Administered 2012-11-06: 10 mg via INTRAVENOUS

## 2012-11-06 MED ORDER — OXYCODONE-ACETAMINOPHEN 5-325 MG PO TABS
1.0000 | ORAL_TABLET | ORAL | Status: DC | PRN
Start: 2012-11-06 — End: 2012-11-06

## 2012-11-06 MED ORDER — OXYCODONE-ACETAMINOPHEN 5-325 MG PO TABS: 1.0000 | ORAL_TABLET | ORAL | Status: DC | PRN

## 2012-11-06 MED ORDER — ONDANSETRON HCL 4 MG/2ML IJ SOLN: 4.0000 mg | INTRAMUSCULAR | Status: DC | PRN

## 2012-11-06 MED ORDER — SUCCINYLCHOLINE CHLORIDE 20 MG/ML IJ SOLN
INTRAMUSCULAR | Status: DC | PRN
  Administered 2012-11-06: 120 mg via INTRAVENOUS

## 2012-11-06 MED ORDER — CHLORHEXIDINE GLUCONATE 4 % EX LIQD: 1.0000 "application " | Freq: Once | CUTANEOUS | Status: DC

## 2012-11-06 MED ORDER — DEXTROSE 5 % IV SOLN
3.0000 g | INTRAVENOUS | Status: AC
  Administered 2012-11-06: 3 g via INTRAVENOUS

## 2012-11-06 MED ORDER — DEXAMETHASONE SODIUM PHOSPHATE 4 MG/ML IJ SOLN
INTRAMUSCULAR | Status: DC | PRN
  Administered 2012-11-06: 10 mg via INTRAVENOUS

## 2012-11-06 MED ORDER — KETOROLAC TROMETHAMINE 15 MG/ML IJ SOLN
INTRAMUSCULAR | Status: DC | PRN
  Administered 2012-11-06: 30 mg via INTRAVENOUS

## 2012-11-06 MED ORDER — MORPHINE SULFATE 2 MG/ML IJ SOLN: 2.0000 mg | INTRAMUSCULAR | Status: DC | PRN

## 2012-11-06 MED ORDER — MIDAZOLAM HCL 5 MG/5ML IJ SOLN
INTRAMUSCULAR | Status: DC | PRN
  Administered 2012-11-06: 2 mg via INTRAVENOUS

## 2012-11-06 MED ORDER — NEOSTIGMINE METHYLSULFATE 1 MG/ML IJ SOLN
INTRAMUSCULAR | Status: DC | PRN
  Administered 2012-11-06: 4 mg via INTRAVENOUS

## 2012-11-06 MED ORDER — GLYCOPYRROLATE 0.2 MG/ML IJ SOLN
INTRAMUSCULAR | Status: DC | PRN
  Administered 2012-11-06: 0.6 mg via INTRAVENOUS

## 2012-11-06 MED ORDER — SODIUM CHLORIDE 0.9 % IV SOLN
INTRAVENOUS | Status: DC | PRN
  Administered 2012-11-06: 13:00:00

## 2012-11-06 MED ORDER — ROCURONIUM BROMIDE 100 MG/10ML IV SOLN
INTRAVENOUS | Status: DC | PRN
  Administered 2012-11-06: 25 mg via INTRAVENOUS
  Administered 2012-11-06: 15 mg via INTRAVENOUS

## 2012-11-06 SURGICAL SUPPLY — 45 items
APL SKNCLS STERI-STRIP NONHPOA (GAUZE/BANDAGES/DRESSINGS) ×1
APPLIER CLIP ROT 10 11.4 M/L (STAPLE) ×2
APR CLP MED LRG 11.4X10 (STAPLE) ×1
BAG SPEC RTRVL LRG 6X4 10 (ENDOMECHANICALS) ×1
BENZOIN TINCTURE PRP APPL 2/3 (GAUZE/BANDAGES/DRESSINGS) ×2 IMPLANT
BLADE SURG ROTATE 9660 (MISCELLANEOUS) IMPLANT
CANISTER SUCTION 2500CC (MISCELLANEOUS) ×2 IMPLANT
CHLORAPREP W/TINT 26ML (MISCELLANEOUS) ×2 IMPLANT
CLIP APPLIE ROT 10 11.4 M/L (STAPLE) ×1 IMPLANT
CLOTH BEACON ORANGE TIMEOUT ST (SAFETY) ×2 IMPLANT
COVER MAYO STAND STRL (DRAPES) ×2 IMPLANT
COVER SURGICAL LIGHT HANDLE (MISCELLANEOUS) ×2 IMPLANT
DECANTER SPIKE VIAL GLASS SM (MISCELLANEOUS) ×3 IMPLANT
DRAPE C-ARM 42X72 X-RAY (DRAPES) ×2 IMPLANT
DRAPE UTILITY 15X26 W/TAPE STR (DRAPE) ×4 IMPLANT
DRSG TEGADERM 2-3/8X2-3/4 SM (GAUZE/BANDAGES/DRESSINGS) ×3 IMPLANT
DRSG TEGADERM 4X4.75 (GAUZE/BANDAGES/DRESSINGS) ×2 IMPLANT
ELECT REM PT RETURN 9FT ADLT (ELECTROSURGICAL) ×2
ELECTRODE REM PT RTRN 9FT ADLT (ELECTROSURGICAL) ×1 IMPLANT
FILTER SMOKE EVAC LAPAROSHD (FILTER) ×2 IMPLANT
GLOVE BIO SURGEON STRL SZ7 (GLOVE) ×3 IMPLANT
GLOVE BIOGEL PI IND STRL 7.5 (GLOVE) ×1 IMPLANT
GLOVE BIOGEL PI INDICATOR 7.5 (GLOVE) ×1
GLOVE INDICATOR 7.0 STRL GRN (GLOVE) ×2 IMPLANT
GLOVE SKINSENSE NS SZ7.0 (GLOVE) ×1
GLOVE SKINSENSE STRL SZ7.0 (GLOVE) IMPLANT
GOWN STRL NON-REIN LRG LVL3 (GOWN DISPOSABLE) ×8 IMPLANT
HEMOSTAT SNOW SURGICEL 2X4 (HEMOSTASIS) ×1 IMPLANT
NS IRRIG 1000ML POUR BTL (IV SOLUTION) IMPLANT
PACK BASIN DAY SURGERY FS (CUSTOM PROCEDURE TRAY) ×2 IMPLANT
PAD ARMBOARD 7.5X6 YLW CONV (MISCELLANEOUS) ×2 IMPLANT
POUCH SPECIMEN RETRIEVAL 10MM (ENDOMECHANICALS) ×2 IMPLANT
SCISSORS LAP 5X35 DISP (ENDOMECHANICALS) IMPLANT
SET CHOLANGIOGRAPH 5 50 .035 (SET/KITS/TRAYS/PACK) ×2 IMPLANT
SET IRRIG TUBING LAPAROSCOPIC (IRRIGATION / IRRIGATOR) ×2 IMPLANT
SLEEVE ENDOPATH XCEL 5M (ENDOMECHANICALS) ×2 IMPLANT
SPECIMEN JAR SMALL (MISCELLANEOUS) ×2 IMPLANT
SPONGE GAUZE 2X2 8PLY STRL LF (GAUZE/BANDAGES/DRESSINGS) ×2 IMPLANT
SUT MNCRL AB 4-0 PS2 18 (SUTURE) ×2 IMPLANT
SUT VICRYL 0 UR6 27IN ABS (SUTURE) IMPLANT
TOWEL OR 17X24 6PK STRL BLUE (TOWEL DISPOSABLE) ×2 IMPLANT
TRAY LAPAROSCOPIC (CUSTOM PROCEDURE TRAY) ×2 IMPLANT
TROCAR XCEL BLUNT TIP 100MML (ENDOMECHANICALS) ×2 IMPLANT
TROCAR XCEL NON-BLD 11X100MML (ENDOMECHANICALS) ×2 IMPLANT
TROCAR XCEL NON-BLD 5MMX100MML (ENDOMECHANICALS) ×2 IMPLANT

## 2012-11-06 NOTE — Op Note (Signed)
Laparoscopic Cholecystectomy with IOC Procedure Note  Indications: This patient presents with symptomatic gallbladder disease and will undergo laparoscopic cholecystectomy.  Pre-operative Diagnosis: Calculus of gallbladder with other cholecystitis, without mention of obstruction  Post-operative Diagnosis: Same  Surgeon: Jenson Beedle K.   Assistants: Cicero Duck   Anesthesia: General endotracheal anesthesia  ASA Class: 3  Procedure Details  The patient was seen again in the Holding Room. The risks, benefits, complications, treatment options, and expected outcomes were discussed with the patient. The possibilities of reaction to medication, pulmonary aspiration, perforation of viscus, bleeding, recurrent infection, finding a normal gallbladder, the need for additional procedures, failure to diagnose a condition, the possible need to convert to an open procedure, and creating a complication requiring transfusion or operation were discussed with the patient. The likelihood of improving the patient's symptoms with return to their baseline status is good.  The patient and/or family concurred with the proposed plan, giving informed consent. The site of surgery properly noted. The patient was taken to Operating Room, identified as Marissa Marks and the procedure verified as Laparoscopic Cholecystectomy with Intraoperative Cholangiogram. A Time Out was held and the above information confirmed.  Prior to the induction of general anesthesia, antibiotic prophylaxis was administered. General endotracheal anesthesia was then administered and tolerated well. After the induction, the abdomen was prepped with Chloraprep and draped in the sterile fashion. The patient was positioned in the supine position.  Local anesthetic agent was injected into the skin near the umbilicus and an incision made. We dissected down to the abdominal fascia with blunt dissection.  The fascia was incised vertically and we entered  the peritoneal cavity bluntly.  A pursestring suture of 0-Vicryl was placed around the fascial opening.  The Hasson cannula was inserted and secured with the stay suture.  Pneumoperitoneum was then created with CO2 and tolerated well without any adverse changes in the patient's vital signs. An 11-mm port was placed in the subxiphoid position.  Two 5-mm ports were placed in the right upper quadrant. All skin incisions were infiltrated with a local anesthetic agent before making the incision and placing the trocars.   We positioned the patient in reverse Trendelenburg, tilted slightly to the patient's left.  The gallbladder was identified, the fundus grasped and retracted cephalad. Adhesions were lysed bluntly and with the electrocautery where indicated, taking care not to injure any adjacent organs or viscus. The infundibulum was grasped and retracted laterally, exposing the peritoneum overlying the triangle of Calot. This was then divided and exposed in a blunt fashion. A critical view of the cystic duct and cystic artery was obtained.  The cystic duct was clearly identified and bluntly dissected circumferentially. The cystic duct was ligated with a clip distally.   An incision was made in the cystic duct and the [facility] cholangiogram catheter introduced. The catheter was secured using a clip. A cholangiogram was then obtained which showed good visualization of the distal and proximal biliary tree with no sign of filling defects or obstruction.  Contrast flowed easily into the duodenum. The catheter was then removed.   The cystic duct was then ligated with clips and divided. The cystic artery was identified, dissected free, ligated with clips and divided as well.   The gallbladder was dissected from the liver bed in retrograde fashion with the electrocautery. The gallbladder was removed and placed in an Endocatch sac. The liver bed was irrigated and inspected. Hemostasis was achieved with the electrocautery.  Copious irrigation was utilized and was repeatedly aspirated  until clear.  The gallbladder and Endocatch sac were then removed through the umbilical port site.  The pursestring suture was used to close the umbilical fascia.    We again inspected the right upper quadrant for hemostasis.  Pneumoperitoneum was released as we removed the trocars.  4-0 Monocryl was used to close the skin.   Benzoin, steri-strips, and clean dressings were applied. The patient was then extubated and brought to the recovery room in stable condition. Instrument, sponge, and needle counts were correct at closure and at the conclusion of the case.   Findings: Cholecystitis with Cholelithiasis  Estimated Blood Loss: Minimal         Drains: none          Specimens: Gallbladder           Complications: None; patient tolerated the procedure well.         Disposition: PACU - hemodynamically stable.         Condition: stable  Wilmon Arms. Corliss Skains, MD, [facility]  11/06/2012 1:22 PM

## 2012-11-06 NOTE — Interval H&P Note (Signed)
History and Physical Interval Note:  11/06/2012 11:59 AM  Marissa Marks  has presented today for surgery, with the diagnosis of Chronic calculous cholecystitis  The various methods of treatment have been discussed with the patient and family. After consideration of risks, benefits and other options for treatment, the patient has consented to  Procedure(s) (LRB) with comments: LAPAROSCOPIC CHOLECYSTECTOMY WITH INTRAOPERATIVE CHOLANGIOGRAM (N/A) as a surgical intervention .  The patient's history has been reviewed, patient examined, no change in status, stable for surgery.  I have reviewed the patient's chart and labs.  Questions were answered to the patient's satisfaction.     Josey Forcier K.

## 2012-11-06 NOTE — Addendum Note (Signed)
Addendum  created 11/06/12 1514 by Judie Petit, MD   Modules edited:Orders

## 2012-11-06 NOTE — Anesthesia Postprocedure Evaluation (Signed)
  Anesthesia Post-op Note  Patient: Marissa Marks  Procedure(s) Performed: Procedure(s) (LRB) with comments: LAPAROSCOPIC CHOLECYSTECTOMY WITH INTRAOPERATIVE CHOLANGIOGRAM (N/A)  Patient Location: PACU  Anesthesia Type:General  Level of Consciousness: awake  Airway and Oxygen Therapy: Patient Spontanous Breathing  Post-op Pain: mild  Post-op Assessment: Post-op Vital signs reviewed  Post-op Vital Signs: Reviewed  Complications: No apparent anesthesia complications

## 2012-11-06 NOTE — Transfer of Care (Signed)
Immediate Anesthesia Transfer of Care Note  Patient: Marissa Marks  Procedure(s) Performed: Procedure(s) (LRB) with comments: LAPAROSCOPIC CHOLECYSTECTOMY WITH INTRAOPERATIVE CHOLANGIOGRAM (N/A)  Patient Location: PACU  Anesthesia Type:General  Level of Consciousness: awake, alert  and oriented  Airway & Oxygen Therapy: Patient Spontanous Breathing and Patient connected to face mask oxygen  Post-op Assessment: Report given to PACU RN and Post -op Vital signs reviewed and stable  Post vital signs: Reviewed and stable  Complications: No apparent anesthesia complications

## 2012-11-06 NOTE — Anesthesia Procedure Notes (Signed)
Procedure Name: Intubation Date/Time: 11/06/2012 12:13 PM Performed by: Burna Cash Pre-anesthesia Checklist: Patient identified, Emergency Drugs available, Suction available, Patient being monitored and Timeout performed Patient Re-evaluated:Patient Re-evaluated prior to inductionOxygen Delivery Method: Circle System Utilized Preoxygenation: Pre-oxygenation with 100% oxygen Intubation Type: IV induction Ventilation: Mask ventilation without difficulty Laryngoscope Size: Miller and 3 (good view of cords with pressure over cords) Grade View: Grade I Tube type: Oral Tube size: 7.0 mm Number of attempts: 1 Airway Equipment and Method: stylet and oral airway Placement Confirmation: ETT inserted through vocal cords under direct vision,  positive ETCO2 and breath sounds checked- equal and bilateral Secured at: 22 cm Tube secured with: Tape Dental Injury: Teeth and Oropharynx as per pre-operative assessment

## 2012-11-06 NOTE — H&P (View-Only) (Signed)
Patient ID: Marissa Marks, female   DOB: 04-Jul-1966, 46 y.o.   MRN: [id number]  Chief Complaint  Patient presents with  . Pre-op Exam    eval gallbladder with stones    HPI Marissa Marks is a 46 y.o. female.  Referred by Dr. Claudette Head for evaluation of gallbladder disease. HPI This is a 46 year old female who has had two recent ER visits for epigastric pain radiating up into her left chest, into her back, and around her left side.  Cardiac disease and pulmonary embolism were ruled out.  She has had a normal contrast swallow.  She was referred to Dr. Russella Dar for evaluation, and he ordered a RUQ ultrasound.  This showed gallstones with no sign of cholecystitis.  She did have some nausea/ vomiting, and some post-prandial diarrhea. She is now referred for surgical evaluation.   The first episode occurred after eating [place] takeout and the second episode occurred after eating at [facility].  Past Medical History  Diagnosis Date  . Hypertension     Past Surgical History  Procedure Date  . Cesarean section 11/10/86 09/06/88    x2    Family History  Problem Relation Age of Onset  . Heart disease Father   . Heart disease Maternal Grandmother   . Heart disease Maternal Grandfather   . Heart disease Paternal Grandmother   . Ovarian cancer Paternal Grandmother   . Heart disease Paternal Grandfather   . Parkinson's disease Mother     Social History History  Substance Use Topics  . Smoking status: Never Smoker   . Smokeless tobacco: Never Used  . Alcohol Use: No    No Known Allergies  Current Outpatient Prescriptions  Medication Sig Dispense Refill  . aspirin EC 81 MG tablet Take 81 mg by mouth daily.      [name] Kitchen lisinopril (PRINIVIL,ZESTRIL) 20 MG tablet Take 20 mg by mouth daily.      [name] Kitchen PRESCRIPTION MEDICATION Patient has an IUD.      [name] Kitchen HYDROcodone-acetaminophen (NORCO/VICODIN) 5-325 MG per tablet Take 1 tablet by mouth every 4 (four) hours as needed for pain.  20 tablet  0     Review of Systems Review of Systems  Constitutional: Negative for fever, chills and unexpected weight change.  HENT: Negative for hearing loss, congestion, sore throat, trouble swallowing and voice change.   Eyes: Negative for visual disturbance.  Respiratory: Negative for cough and wheezing.   Cardiovascular: Negative for chest pain, palpitations and leg swelling.  Gastrointestinal: Positive for nausea, vomiting, abdominal pain and diarrhea. Negative for constipation, blood in stool, abdominal distention and anal bleeding.  Genitourinary: Negative for hematuria, vaginal bleeding and difficulty urinating.  Musculoskeletal: Negative for arthralgias.  Skin: Negative for rash and wound.  Neurological: Negative for seizures, syncope and headaches.  Hematological: Negative for adenopathy. Does not bruise/bleed easily.  Psychiatric/Behavioral: Negative for confusion.    Blood pressure 132/80, pulse 81, temperature 97.4 F (36.3 C), temperature source Temporal, height 5\' 6"  (1.676 m), weight 281 lb (127.461 kg), last menstrual period 10/10/2012, SpO2 97.00%.  Physical Exam Physical Exam WDWN in NAD HEENT:  EOMI, sclera anicteric Neck:  No masses, no thyromegaly Lungs:  CTA bilaterally; normal respiratory effort CV:  Regular rate and rhythm; no murmurs Abd:  +bowel sounds, soft, non-tender, no masses; no RUQ or epigastric tenderness Ext:  Well-perfused; no edema Skin:  Warm, dry; no sign of jaundice  Data Reviewed     *RADIOLOGY REPORT*  Clinical Data: Epigastric pain,  elevated LDH  COMPLETE ABDOMINAL ULTRASOUND  Comparison: None.  Findings:  Gallbladder: Multiple gallstones are noted filling the  gallbladder. No thickening of the gallbladder wall. No  sonographic Murphy's sign.  Common bile duct: Measures 4.4 mm in diameter within normal  limits.  Liver: No focal lesion identified. Within normal limits in  parenchymal echogenicity.  IVC: Appears normal.  Pancreas: No  focal abnormality seen.  Spleen: Measures 7.3 cm in length. Normal echogenicity.  Right Kidney: Measures 12 cm in length. No mass, hydronephrosis  or diagnostic renal calculus  Left Kidney: Measures 11.8 cm in length. No mass, hydronephrosis  or diagnostic renal calculi  Abdominal aorta: No aneurysm identified. Measures up to 2.2 cm in  diameter.  IMPRESSION:  1. Multiple gallstones are noted filling the gallbladder. No  thickening of gallbladder wall. No sonographic Murphy's sign.  2. Normal CBD.  3. No hydronephrosis or diagnostic renal calculus.  Original Report Authenticated By: Natasha Mead, M.D.   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  Most of her tenderness is epigastric going out to her left side, but these attacks are post-prandial with nausea, vomiting, and diarrhea.      Plan    Recommend laparoscopic cholecystectomy with intraoperative cholangiogram.  The surgical procedure has been discussed with the patient.  Potential risks, benefits, alternative treatments, and expected outcomes have been explained.  All of the patient's questions at this time have been answered.  The likelihood of reaching the patient's treatment goal is good.  The patient understand the proposed surgical procedure and wishes to proceed.        Josephmichael Lisenbee K. 10/29/2012, 5:13 PM

## 2012-11-06 NOTE — Anesthesia Preprocedure Evaluation (Signed)
Anesthesia Evaluation  Patient identified by MRN, date of birth, ID band Patient awake    Reviewed: Allergy & Precautions, H&P , NPO status   Airway Mallampati: II      Dental   Pulmonary neg pulmonary ROS,  breath sounds clear to auscultation        Cardiovascular hypertension, Pt. on medications Rhythm:Regular Rate:Normal     Neuro/Psych    GI/Hepatic negative GI ROS, Neg liver ROS,   Endo/Other  negative endocrine ROS  Renal/GU negative Renal ROS     Musculoskeletal   Abdominal   Peds  Hematology   Anesthesia Other Findings   Reproductive/Obstetrics                           Anesthesia Physical Anesthesia Plan  ASA: III  Anesthesia Plan: General   Post-op Pain Management:    Induction: Intravenous  Airway Management Planned: Oral ETT  Additional Equipment:   Intra-op Plan:   Post-operative Plan: Extubation in OR  Informed Consent: I have reviewed the patients History and Physical, chart, labs and discussed the procedure including the risks, benefits and alternatives for the proposed anesthesia with the patient or authorized representative who has indicated his/her understanding and acceptance.     Plan Discussed with: CRNA, Anesthesiologist and Surgeon  Anesthesia Plan Comments:         Anesthesia Quick Evaluation

## 2012-11-07 ENCOUNTER — Encounter (HOSPITAL_BASED_OUTPATIENT_CLINIC_OR_DEPARTMENT_OTHER): Payer: Self-pay | Admitting: Surgery

## 2012-11-15 ENCOUNTER — Emergency Department (HOSPITAL_COMMUNITY)
Admission: EM | Admit: 2012-11-15 | Discharge: 2012-11-16 | Disposition: A | Discharge status: Home / Self Care | Payer: BC Managed Care – PPO | Attending: Emergency Medicine | Admitting: Emergency Medicine

## 2012-11-15 ENCOUNTER — Emergency Department (HOSPITAL_COMMUNITY): Payer: BC Managed Care – PPO

## 2012-11-15 ENCOUNTER — Encounter (HOSPITAL_COMMUNITY): Payer: Self-pay | Admitting: *Deleted

## 2012-11-15 ENCOUNTER — Telehealth (INDEPENDENT_AMBULATORY_CARE_PROVIDER_SITE_OTHER): Payer: Self-pay

## 2012-11-15 DIAGNOSIS — Z48815 Encounter for surgical aftercare following surgery on the digestive system: Secondary | ICD-10-CM | POA: Insufficient documentation

## 2012-11-15 DIAGNOSIS — R071 Chest pain on breathing: Secondary | ICD-10-CM | POA: Insufficient documentation

## 2012-11-15 DIAGNOSIS — Y836 Removal of other organ (partial) (total) as the cause of abnormal reaction of the patient, or of later complication, without mention of misadventure at the time of the procedure: Secondary | ICD-10-CM | POA: Insufficient documentation

## 2012-11-15 DIAGNOSIS — IMO0002 Reserved for concepts with insufficient information to code with codable children: Secondary | ICD-10-CM | POA: Insufficient documentation

## 2012-11-15 DIAGNOSIS — G8918 Other acute postprocedural pain: Secondary | ICD-10-CM

## 2012-11-15 DIAGNOSIS — Z7982 Long term (current) use of aspirin: Secondary | ICD-10-CM | POA: Insufficient documentation

## 2012-11-15 DIAGNOSIS — T148XXA Other injury of unspecified body region, initial encounter: Secondary | ICD-10-CM

## 2012-11-15 DIAGNOSIS — I10 Essential (primary) hypertension: Secondary | ICD-10-CM | POA: Insufficient documentation

## 2012-11-15 DIAGNOSIS — Z9089 Acquired absence of other organs: Secondary | ICD-10-CM | POA: Insufficient documentation

## 2012-11-15 DIAGNOSIS — Z79899 Other long term (current) drug therapy: Secondary | ICD-10-CM | POA: Insufficient documentation

## 2012-11-15 LAB — CBC WITH DIFFERENTIAL/PLATELET
Basophils Absolute: 0 10*3/uL (ref 0.0–0.1)
Basophils Relative: 0 % (ref 0–1)
Eosinophils Absolute: 0.1 10*3/uL (ref 0.0–0.7)
Eosinophils Relative: 1 % (ref 0–5)
HCT: 35.2 % — ABNORMAL LOW (ref 36.0–46.0)
MCH: 27.8 pg (ref 26.0–34.0)
MCHC: 31.5 g/dL (ref 30.0–36.0)
MCV: 88.2 fL (ref 78.0–100.0)
Monocytes Absolute: 0.5 10*3/uL (ref 0.1–1.0)
Neutro Abs: 5 10*3/uL (ref 1.7–7.7)
RDW: 12.9 % (ref 11.5–15.5)

## 2012-11-15 LAB — BASIC METABOLIC PANEL
BUN: 11 mg/dL (ref 6–23)
Calcium: 9.2 mg/dL (ref 8.4–10.5)
Creatinine, Ser: 0.8 mg/dL (ref 0.50–1.10)
GFR calc Af Amer: >90 mL/min (ref >90)
GFR calc non Af Amer: 88 mL/min — ABNORMAL LOW (ref >90)

## 2012-11-15 MED ORDER — MORPHINE SULFATE 4 MG/ML IJ SOLN: 4.0000 mg | Freq: Once | INTRAMUSCULAR | Status: DC

## 2012-11-15 MED ORDER — SODIUM CHLORIDE 0.9 % IV BOLUS (SEPSIS)
1000.0000 mL | Freq: Once | INTRAVENOUS | Status: AC
  Administered 2012-11-15: 1000 mL via INTRAVENOUS

## 2012-11-15 MED ORDER — ONDANSETRON HCL 4 MG/2ML IJ SOLN: 4.0000 mg | Freq: Once | INTRAMUSCULAR | Status: DC

## 2012-11-15 NOTE — Telephone Encounter (Signed)
Pt called stating starting 3 days ago she has been having ruq pain under rib cage. Worse with deep breath. It has been slowly getting worse. No fever. No vomiting. I reviewed this with Dr Biagio Quint. Per Dr Delice Lesch order pt advised to go to ER to have pain assessed. Pt states she will go as soon as her husband picks her up.

## 2012-11-15 NOTE — ED Notes (Signed)
Patient transported to X-ray 

## 2012-11-15 NOTE — ED Notes (Signed)
Pt reports having gallbladder removed on December 17th by [facility].  Reports having (R) side pain-pt does have a knot that is palpable.  Pt called the Dr. And was told to come here to rule out embolism.  Pt denies fever, N/V.

## 2012-11-15 NOTE — ED Notes (Signed)
Pt denied pain medicine at this time. States she is not in that much pain at the moment.

## 2012-11-16 ENCOUNTER — Encounter (HOSPITAL_COMMUNITY): Payer: Self-pay | Admitting: Radiology

## 2012-11-16 ENCOUNTER — Telehealth (INDEPENDENT_AMBULATORY_CARE_PROVIDER_SITE_OTHER): Payer: Self-pay

## 2012-11-16 MED ORDER — IOHEXOL 350 MG/ML SOLN
100.0000 mL | Freq: Once | INTRAVENOUS | Status: AC | PRN
  Administered 2012-11-16: 100 mL via INTRAVENOUS

## 2012-11-16 NOTE — ED Provider Notes (Signed)
History     CSN: [id number]  Arrival date & time 11/15/12  1815   First MD Initiated Contact with Patient 11/15/12 2145      Chief Complaint  Patient presents with  . Post-op Problem    (Consider location/radiation/quality/duration/timing/severity/associated sxs/prior treatment) HPI Comments: Pt comes in with cc of abd pain. Pt is s/p recent cholecystectomy. She states that about 3 days ago, she started having RUQ abd pain, but not at the incision site, and the the pain is worse with inspiration. She has no n/v/f/c. No cough. No hx of PE, DVT.  Also states that she feels her abd is distended. Last BM was yday, passing flatus.   The history is provided by the patient.    Past Medical History  Diagnosis Date  . Hypertension     Past Surgical History  Procedure Date  . Cesarean section 11/10/86 09/06/88    x2  . Cholecystectomy 11/06/2012    Procedure: LAPAROSCOPIC CHOLECYSTECTOMY WITH INTRAOPERATIVE CHOLANGIOGRAM;  Surgeon: Wilmon Arms. Corliss Skains, MD;  Location: [facility];  Service: General;  Laterality: N/A;    Family History  Problem Relation Age of Onset  . Heart disease Father   . Heart disease Maternal Grandmother   . Heart disease Maternal Grandfather   . Heart disease Paternal Grandmother   . Ovarian cancer Paternal Grandmother   . Heart disease Paternal Grandfather   . Parkinson's disease Mother     History  Substance Use Topics  . Smoking status: Never Smoker   . Smokeless tobacco: Never Used  . Alcohol Use: No    OB History    Grav Para Term Preterm Abortions TAB SAB Ect Mult Living                  Review of Systems  Constitutional: Positive for activity change.  HENT: Negative for neck pain.   Respiratory: Negative for shortness of breath.   Cardiovascular: Positive for chest pain.  Gastrointestinal: Positive for abdominal pain. Negative for nausea and vomiting.  Genitourinary: Negative for dysuria.  Neurological: Negative for  headaches.    Allergies  Review of patient's allergies indicates no known allergies.  Home Medications   Current Outpatient Rx  Name  Route  Sig  Dispense  Refill  . ASPIRIN EC 81 MG PO TBEC   Oral   Take 81 mg by mouth daily.         [name] Kitchen LISINOPRIL 20 MG PO TABS   Oral   Take 20 mg by mouth daily.         [name] Kitchen PRESCRIPTION MEDICATION      Patient has an IUD.           BP 125/60  Pulse 80  Temp 98 F (36.7 C) (Oral)  Resp 18  SpO2 98%  LMP 10/31/2012  Physical Exam  Nursing note and vitals reviewed. Constitutional: She is oriented to person, place, and time. She appears well-developed.  HENT:  Head: Normocephalic and atraumatic.  Eyes: Conjunctivae normal and EOM are normal. Pupils are equal, round, and reactive to light.  Neck: Normal range of motion. Neck supple.  Cardiovascular: Normal rate, regular rhythm and normal heart sounds.   Pulmonary/Chest: Effort normal and breath sounds normal. No respiratory distress.  Abdominal: Soft. Bowel sounds are normal. She exhibits distension. There is no tenderness. There is no rebound and no guarding.  Musculoskeletal: She exhibits no edema.  Neurological: She is alert and oriented to person, place, and time.  Skin:  Skin is warm and dry.    ED Course  Procedures (including critical care time)  Labs Reviewed  CBC WITH DIFFERENTIAL - Abnormal; Notable for the following:    Hemoglobin 11.1 (*)     HCT 35.2 (*)     All other components within normal limits  BASIC METABOLIC PANEL - Abnormal; Notable for the following:    Glucose, Bld 101 (*)     GFR calc non Af Amer 88 (*)     All other components within normal limits   Ct Angio Chest Pe W/cm &/or Wo Cm  11/16/2012  *RADIOLOGY REPORT*  Clinical Data: Pleuritic chest pain and right upper quadrant abdominal pain, status post cholecystectomy.  CT ANGIOGRAPHY CHEST  Technique:  Multidetector CT imaging of the chest using the standard protocol during bolus administration  of intravenous contrast. Multiplanar reconstructed images including MIPs were obtained and reviewed to evaluate the vascular anatomy.  Contrast: OMNIPAQUE IOHEXOL 350 MG/ML SOLN  Comparison: CTA of the chest performed 06/04/2012, and chest radiograph performed 11/15/2012  Findings: There is no evidence of pulmonary embolus.  Minimal right basilar atelectasis is noted.  The lungs are otherwise clear.  There is no evidence of significant focal consolidation, pleural effusion or pneumothorax.  No masses are identified; no abnormal focal contrast enhancement is seen.  The mediastinum is unremarkable in appearance.  No mediastinal lymphadenopathy is seen.  No pericardial effusion is identified. The great vessels are unremarkable in appearance.  No axillary lymphadenopathy is seen.  The visualized portions of the thyroid gland are unremarkable in appearance.  The visualized portions of the liver and spleen are unremarkable.  No acute osseous abnormalities are seen.  IMPRESSION:  1.  No evidence of pulmonary embolus. 2.  Minimal right basilar atelectasis noted; lungs otherwise clear.   Original Report Authenticated By: Tonia Ghent, M.D.    Dg Abd Acute W/chest  11/15/2012  *RADIOLOGY REPORT*  Clinical Data: 46 year old female with abdominal pain and distention.  ACUTE ABDOMEN SERIES (ABDOMEN 2 VIEW & CHEST 1 VIEW)  Comparison: 10/12/2012 chest radiograph  Findings: The cardiomediastinal silhouette is unremarkable. The lungs are clear. There is no evidence of airspace disease, pleural effusion or pneumothorax.  A small to moderate amount of stool and gas in the colon is noted. There is no evidence of bowel obstruction or pneumoperitoneum. Cholecystectomy clips and IUD are noted. No bony abnormalities identified.  IMPRESSION: No evidence of acute abnormality.  Small to moderate stool and gas in the colon.  No evidence of bowel obstruction or pneumoperitoneum.   Original Report Authenticated By: Harmon Pier, M.D.        1. Post-op pain   2. Hematoma       MDM  Pt comes in with cc of abd pain with some distention. The pain is in the RUQ, and in the thoracic region and is pleuritic and worse with palpation. Also, there is a small palpable mass - likely a hematoma. We will get CT to r/o PE, as she had a recent surgery, will get CBC. AAS to ensure there is no large amount of free air, abd exam is non peritoneal.  Derwood Kaplan, MD 11/16/12 [phone]

## 2012-11-16 NOTE — Telephone Encounter (Signed)
Pt called to let us know she did go to ER and was released. Pt was advised by ER MD area was probably a small hematoma from surgery. Pt will treat it with low warm heat and advil. Pt is to call if fever,vomiting or symptoms increase or with any concerns. Pt to  f/u appt with Dr Corliss Skains 11-28-12 unless symptoms change.

## 2012-11-22 ENCOUNTER — Telehealth (INDEPENDENT_AMBULATORY_CARE_PROVIDER_SITE_OTHER): Payer: Self-pay | Admitting: General Surgery

## 2012-11-22 ENCOUNTER — Encounter (INDEPENDENT_AMBULATORY_CARE_PROVIDER_SITE_OTHER): Payer: Self-pay | Admitting: General Surgery

## 2012-11-22 NOTE — Telephone Encounter (Signed)
I faxed RTW note for patient to go back to [employer] on 11-26-2012. 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 She has a follow apt on 11-28-2012 to see Dr Corliss Skains

## 2012-11-28 ENCOUNTER — Encounter (INDEPENDENT_AMBULATORY_CARE_PROVIDER_SITE_OTHER): Payer: Self-pay | Admitting: Surgery

## 2012-11-28 ENCOUNTER — Ambulatory Visit (INDEPENDENT_AMBULATORY_CARE_PROVIDER_SITE_OTHER): Payer: BC Managed Care – PPO | Admitting: Surgery

## 2012-11-28 VITALS — BP 118/70 | HR 96 | Temp 97.8°F | Ht 66.0 in | Wt 285.0 lb

## 2012-11-28 DIAGNOSIS — K8010 Calculus of gallbladder with chronic cholecystitis without obstruction: Secondary | ICD-10-CM

## 2012-11-28 NOTE — Progress Notes (Signed)
Status post laparoscopic cholecystectomy with intraoperative cholangiogram on 11/06/12. About one week after surgery the patient began having some right upper quadrant pain right at her costal margin. She was evaluated in emergency department. Chest x-ray was normal. CT angio of the chest was negative for any pulmonary embolism. There were no signs of complications from the surgery. This pain is improving but she still gets intermittent pain right at her costal margin. This is not located anywhere near her incisions.  Filed Vitals:   11/28/12 1410  BP: 118/70  Pulse: 96  Temp: 97.8 F (36.6 C)    Well-developed well-nourished in no apparent stress HEENT-sclera anicteric Abdomen-soft with well-healed incisions. Point tenderness on her right costal margin about 5 cm lateral to her cholangiogram site. No palpable masses in this area.  Impression: Status post laparoscopic cholecystectomy for chronic calculus cholecystitis. Right upper quadrant pain possibly secondary to intramuscular hematoma. This seems to be improving. I encouraged patient to continue using anti-inflammatory medications as well as heat over this area. She will followup with [place] when necessary if there's no improvement.  Wilmon Arms. Corliss Skains, MD, [facility]  11/28/2012 2:44 PM

## 2015-05-19 ENCOUNTER — Other Ambulatory Visit: Payer: Self-pay | Admitting: Obstetrics & Gynecology

## 2015-05-21 LAB — CYTOLOGY - PAP

## 2015-06-05 ENCOUNTER — Other Ambulatory Visit: Payer: Self-pay | Admitting: Obstetrics & Gynecology

## 2016-03-16 DIAGNOSIS — I10 Essential (primary) hypertension: Secondary | ICD-10-CM | POA: Insufficient documentation

## 2016-03-16 DIAGNOSIS — E7800 Pure hypercholesterolemia, unspecified: Secondary | ICD-10-CM | POA: Insufficient documentation

## 2016-03-16 DIAGNOSIS — R002 Palpitations: Secondary | ICD-10-CM | POA: Insufficient documentation

## 2016-03-21 ENCOUNTER — Ambulatory Visit (INDEPENDENT_AMBULATORY_CARE_PROVIDER_SITE_OTHER): Payer: BC Managed Care – PPO | Admitting: Cardiology

## 2016-03-21 ENCOUNTER — Encounter: Payer: Self-pay | Admitting: Cardiology

## 2016-03-21 ENCOUNTER — Encounter: Payer: Self-pay | Admitting: *Deleted

## 2016-03-21 DIAGNOSIS — R0789 Other chest pain: Secondary | ICD-10-CM | POA: Diagnosis not present

## 2016-03-21 DIAGNOSIS — R002 Palpitations: Secondary | ICD-10-CM | POA: Diagnosis not present

## 2016-03-21 NOTE — Patient Instructions (Signed)
Medication Instructions:  The current medical regimen is effective;  continue present plan and medications.  Testing/Procedures: Your physician has requested that you have an echocardiogram. Echocardiography is a painless test that uses sound waves to create images of your heart. It provides your doctor with information about the size and shape of your heart and how well your heart's chambers and valves are working. This procedure takes approximately one hour. There are no restrictions for this procedure.  Follow-Up: Follow up as needed.  If you need a refill on your cardiac medications before your next appointment, please call your pharmacy.  Thank you for choosing [facility]!!

## 2016-03-21 NOTE — Progress Notes (Signed)
Cardiology Office Note    Date:  03/21/2016   ID:  Doyne P Krystiana, Woller Feb 24, 1966, MRN [id number]  PCP:  Beatris Si  Cardiologist:   Candee Furbish, MD     History of Present Illness:  Marissa Marks is a 50 y.o. female here for evaluation of palpitations at the request of Corine Shelter. Has a family history of heart disease.  In review of prior office note on 02/24/16, she had a "funny feeling" in her chest and that heartbeat seemed to be more obvious to her and had the sensation of doing somersaults in her chest. Was constant for a few days. No SOB. Challenging to describe. Had a feeling on the top of her left shoulder. Woke her up at 3 AM. Her back and left shoulder had been bothering her. She was helping to move her grandmother. Packing boxes. Still battling with weight/obesity. Propranolol was given her 20 mg twice a day as needed but she has not taken. An EKG previously done on 02/24/16 showed sinus rhythm poor R-wave progression without any other abnormalities. Normal intervals. Nonspecific ST-T wave changes. Personally viewed. Heart rate was 72 bpm. Previously she was on lisinopril but she has not needed this since her blood pressure has been normal. No significant caffiene. Rare. TSH OK.   Now grandmother 1 living with them. Patient of Dr. Lovena Le.   Mild soreness in chest. No syncope. No bleeding. She thinks she is starting menopause. Had more bleeding in the fall.  June 13th has hysterectomy set up. Fibroids.   Family history of CAD - Father MI, Grandparents. CABG.   Stress ECHO 2013 - normal. I performed. Ended up getting gall bladder removed.   Past Medical History  Diagnosis Date  . Hypertension   . Heart palpitations   . Chronic cholecystitis with calculus 10/29/2012  . Hypercholesterolemia     Past Surgical History  Procedure Laterality Date  . Cesarean section  11/10/86 09/06/88    x2  . Cholecystectomy  11/06/2012    Procedure: LAPAROSCOPIC CHOLECYSTECTOMY WITH  INTRAOPERATIVE CHOLANGIOGRAM;  Surgeon: Imogene Burn. Georgette Dover, MD;  Location: [facility];  Service: General;  Laterality: N/A;    Current Medications: Outpatient Prescriptions Prior to Visit  Medication Sig Dispense Refill  . aspirin EC 81 MG tablet Take 81 mg by mouth daily. Reported on 03/16/2016    . Cholecalciferol (VITAMIN D PO) Take 2,000 Units by mouth daily. Reported on 03/16/2016    . fluticasone (FLONASE ALLERGY RELIEF) 50 MCG/ACT nasal spray Place 1 spray into both nostrils daily. Reported on 03/16/2016    . loratadine (CLARITIN) 10 MG tablet Take 10 mg by mouth daily. Reported on 03/16/2016    . PRESCRIPTION MEDICATION Reported on 03/16/2016    . lisinopril (PRINIVIL,ZESTRIL) 20 MG tablet Take 20 mg by mouth daily. Reported on 03/16/2016     No facility-administered medications prior to visit.     Allergies:   Review of patient's allergies indicates no known allergies.   Social History   Social History  . Marital Status: Married    Spouse Name: N/A  . Number of Children: 2  . Years of Education: N/A   Occupational History  . Teacher    Social History Main Topics  . Smoking status: Never Smoker   . Smokeless tobacco: Never Used  . Alcohol Use: No  . Drug Use: No  . Sexual Activity: Not on file   Other Topics Concern  . Not on file  Social History Narrative     Family History:  The patient's family history includes Heart disease in her father, maternal grandfather, maternal grandmother, paternal grandfather, and paternal grandmother; Ovarian cancer in her paternal grandmother; Parkinson's disease in her mother.   ROS:   Please see the history of present illness.    ROS All other systems reviewed and are negative.   PHYSICAL EXAM:   VS:  There were no vitals taken for this visit.   GEN: Well nourished, well developed, in no acute distress HEENT: normal Neck: no JVD, carotid bruits, or masses Cardiac: RRR; no murmurs, rubs, or gallops,no edema    Respiratory:  clear to auscultation bilaterally, normal work of breathing GI: soft, nontender, nondistended, + BS MS: no deformity or atrophy Skin: warm and dry, no rash Neuro:  Alert and Oriented x 3, Strength and sensation are intact Psych: euthymic mood, full affect  Wt Readings from Last 3 Encounters:  11/28/12 285 lb (129.275 kg)  11/06/12 277 lb (125.646 kg)  10/29/12 281 lb (127.461 kg)      Studies/Labs Reviewed:   EKG:  EKG is not ordered today.  See above Recent Labs: No results found for requested labs within last 365 days.   Lipid Panel No results found for: CHOL, TRIG, HDL, CHOLHDL, VLDL, LDLCALC, LDLDIRECT  Additional studies/ records that were reviewed today include:  Prior office notes reviewed, lab work, stress echocardiogram-low risk, normal. 2013.    ASSESSMENT:    1. Palpitations   2. Atypical chest pain   3. Morbid obesity due to excess calories (HCC)      PLAN:  In order of problems listed above:  Palpitations -By description sound like PVCs or PACs which are benign. Instructed her to take the propranolol if necessary. No high-risk symptoms such as syncope. EKG unremarkable. Try to avoid excessive caffeine use. She is drinking water for the most part. Weight loss, sleep hygiene important. She used to work related night sweats and struggles with sleep patterns. Has to get up in the middle night for urination purposes. TSH has been normal. Currently undergoing some hormonal changes as well which can exacerbate palpitations.  Atypical chest pain -Checking echocardiogram. On to ensure normal structure and function of her heart. Sounds musculoskeletal based on mild change when extending neck. Also, recent increase in stress with her grandmother living with her. She is a patient of Dr. Tanna Furry ([facility]).  Morbid obesity -Strongly encourage weight loss. Lifelong battle. Decrease carbohydrates.  Medication Adjustments/Labs and Tests  Ordered: Current medicines are reviewed at length with the patient today.  Concerns regarding medicines are outlined above.  Medication changes, Labs and Tests ordered today are listed in the Patient Instructions below. Patient Instructions  Medication Instructions:  The current medical regimen is effective;  continue present plan and medications.  Testing/Procedures: Your physician has requested that you have an echocardiogram. Echocardiography is a painless test that uses sound waves to create images of your heart. It provides your doctor with information about the size and shape of your heart and how well your heart's chambers and valves are working. This procedure takes approximately one hour. There are no restrictions for this procedure.  Follow-Up: Follow up as needed.  If you need a refill on your cardiac medications before your next appointment, please call your pharmacy.  Thank you for choosing [facility]!!          Signed, Candee Furbish, MD  03/21/2016 11:30 AM    [facility] [address], [place], [place]  [address] Phone: [phone]; Fax: [phone]

## 2016-04-04 ENCOUNTER — Ambulatory Visit (HOSPITAL_COMMUNITY): Payer: BC Managed Care – PPO

## 2016-04-19 ENCOUNTER — Other Ambulatory Visit: Payer: Self-pay

## 2016-04-19 ENCOUNTER — Encounter (HOSPITAL_COMMUNITY): Payer: Self-pay | Admitting: Radiology

## 2016-04-19 ENCOUNTER — Ambulatory Visit (HOSPITAL_COMMUNITY)
Admission: RE | Admit: 2016-04-19 | Discharge: 2016-04-19 | Disposition: A | Discharge status: Home / Self Care | Payer: BC Managed Care – PPO | Source: Ambulatory Visit | Attending: Cardiovascular Disease | Admitting: Cardiovascular Disease

## 2016-04-19 DIAGNOSIS — E785 Hyperlipidemia, unspecified: Secondary | ICD-10-CM | POA: Insufficient documentation

## 2016-04-19 DIAGNOSIS — R002 Palpitations: Secondary | ICD-10-CM | POA: Diagnosis not present

## 2016-04-19 DIAGNOSIS — I119 Hypertensive heart disease without heart failure: Secondary | ICD-10-CM | POA: Insufficient documentation

## 2016-04-20 NOTE — Patient Instructions (Addendum)
Your procedure is scheduled on:  Tuesday, May 03, 2016  Enter through the Main Entrance of [facility] at:  6:00 AM  Pick up the phone at the desk and dial [phone].  Call this number if you have problems the morning of surgery: [phone].  Remember: Do NOT eat food or drink after:  Midnight Monday, May 02, 2016  Take these medicines the morning of surgery with a SIP OF WATER:  None  Do NOT wear jewelry (body piercing), metal hair clips/bobby pins, make-up, or nail polish. Do NOT wear lotions, powders, or perfumes.  You may wear deodorant. Do NOT shave for 48 hours prior to surgery. Do NOT bring valuables to the hospital. Contacts, dentures, or bridgework may not be worn into surgery.  Leave suitcase in car.  After surgery it may be brought to your room.  For patients admitted to the hospital, checkout time is 11:00 AM the day of discharge.

## 2016-04-21 ENCOUNTER — Encounter (HOSPITAL_COMMUNITY): Payer: Self-pay

## 2016-04-21 ENCOUNTER — Encounter (HOSPITAL_COMMUNITY)
Admission: RE | Admit: 2016-04-21 | Discharge: 2016-04-21 | Disposition: A | Discharge status: Home / Self Care | Payer: BC Managed Care – PPO | Source: Ambulatory Visit | Attending: Obstetrics & Gynecology | Admitting: Obstetrics & Gynecology

## 2016-04-21 DIAGNOSIS — Z01812 Encounter for preprocedural laboratory examination: Secondary | ICD-10-CM | POA: Insufficient documentation

## 2016-04-21 HISTORY — DX: Anemia, unspecified: D64.9

## 2016-04-21 HISTORY — DX: Anesthesia of skin: R20.0

## 2016-04-21 HISTORY — DX: Nausea with vomiting, unspecified: Z98.890

## 2016-04-21 HISTORY — DX: Other specified postprocedural states: R11.2

## 2016-04-21 HISTORY — DX: Unspecified osteoarthritis, unspecified site: M19.90

## 2016-04-21 LAB — CBC
HEMATOCRIT: 36.5 % (ref 36.0–46.0)
HEMOGLOBIN: 11.8 g/dL — AB (ref 12.0–15.0)
MCH: 28.6 pg (ref 26.0–34.0)
MCHC: 32.3 g/dL (ref 30.0–36.0)
MCV: 88.4 fL (ref 78.0–100.0)
Platelets: 277 10*3/uL (ref 150–400)
RBC: 4.13 MIL/uL (ref 3.87–5.11)
RDW: 14.1 % (ref 11.5–15.5)
WBC: 17.4 10*3/uL — AB (ref 4.0–10.5)

## 2016-04-21 LAB — BASIC METABOLIC PANEL
Anion gap: 8 (ref 5–15)
BUN: 15 mg/dL (ref 6–20)
CHLORIDE: 103 mmol/L (ref 101–111)
CO2: 25 mmol/L (ref 22–32)
Calcium: 9.3 mg/dL (ref 8.9–10.3)
Creatinine, Ser: 0.92 mg/dL (ref 0.44–1.00)
GFR calc Af Amer: >60 mL/min (ref >60)
GFR calc non Af Amer: >60 mL/min (ref >60)
Glucose, Bld: 100 mg/dL — ABNORMAL HIGH (ref 65–99)
Potassium: 4.4 mmol/L (ref 3.5–5.1)
SODIUM: 136 mmol/L (ref 135–145)

## 2016-04-21 LAB — TYPE AND SCREEN
ABO/RH(D): O POS
ANTIBODY SCREEN: NEGATIVE

## 2016-04-21 LAB — ABO/RH: ABO/RH(D): O POS

## 2016-05-02 MED ORDER — DEXTROSE 5 % IV SOLN
3.0000 g | INTRAVENOUS | Status: AC
  Administered 2016-05-03: 3 g via INTRAVENOUS
  Filled 2016-05-02: qty 3000

## 2016-05-02 NOTE — Anesthesia Preprocedure Evaluation (Addendum)
Anesthesia Evaluation  Patient identified by MRN, date of birth, ID band Patient awake    Reviewed: Allergy & Precautions, NPO status , Patient's Chart, lab work & pertinent test results  History of Anesthesia Complications (+) PONV  Airway Mallampati: III  TM Distance: >3 FB Neck ROM: Full    Dental  (+) Dental Advisory Given   Pulmonary neg pulmonary ROS,    breath sounds clear to auscultation       Cardiovascular hypertension,  Rhythm:Regular Rate:Normal  03/2016 Echo: Left ventricle: The cavity size was normal. Wall thickness was  normal. Systolic function was normal. The estimated ejection  fraction was in the range of 55% to 60%. Wall motion was normal;  there were no regional wall motion abnormalities. Left  ventricular diastolic function parameters were normal. - Left atrium: The atrium was mildly dilated. - Atrial septum: No defect or patent foramen ovale was identified.   Neuro/Psych negative neurological ROS     GI/Hepatic negative GI ROS, Neg liver ROS,   Endo/Other  Morbid obesity  Renal/GU negative Renal ROS     Musculoskeletal   Abdominal   Peds  Hematology  (+) anemia ,   Anesthesia Other Findings   Reproductive/Obstetrics                            Lab Results  Component Value Date   WBC 17.4* 04/21/2016   HGB 11.8* 04/21/2016   HCT 36.5 04/21/2016   MCV 88.4 04/21/2016   PLT 277 04/21/2016   Lab Results  Component Value Date   CREATININE 0.92 04/21/2016   BUN 15 04/21/2016   NA 136 04/21/2016   K 4.4 04/21/2016   CL 103 04/21/2016   CO2 25 04/21/2016    Anesthesia Physical Anesthesia Plan  ASA: III  Anesthesia Plan: General   Post-op Pain Management:    Induction: Intravenous  Airway Management Planned: Oral ETT  Additional Equipment:   Intra-op Plan:   Post-operative Plan: Extubation in OR  Informed Consent: I have reviewed the patients  History and Physical, chart, labs and discussed the procedure including the risks, benefits and alternatives for the proposed anesthesia with the patient or authorized representative who has indicated his/her understanding and acceptance.   Dental advisory given  Plan Discussed with: CRNA  Anesthesia Plan Comments:        Anesthesia Quick Evaluation

## 2016-05-03 ENCOUNTER — Encounter (HOSPITAL_COMMUNITY): Payer: Self-pay | Admitting: *Deleted

## 2016-05-03 ENCOUNTER — Inpatient Hospital Stay (HOSPITAL_COMMUNITY)
Admission: AD | Admit: 2016-05-03 | Discharge: 2016-05-04 | DRG: 742 | Disposition: A | Discharge status: Home / Self Care | Payer: BC Managed Care – PPO | Source: Ambulatory Visit | Attending: Obstetrics & Gynecology | Admitting: Obstetrics & Gynecology

## 2016-05-03 ENCOUNTER — Encounter (HOSPITAL_COMMUNITY)
Admission: AD | Disposition: A | Discharge status: Home / Self Care | Payer: Self-pay | Source: Ambulatory Visit | Attending: Obstetrics & Gynecology

## 2016-05-03 ENCOUNTER — Ambulatory Visit (HOSPITAL_COMMUNITY): Payer: BC Managed Care – PPO | Admitting: Anesthesiology

## 2016-05-03 DIAGNOSIS — I10 Essential (primary) hypertension: Secondary | ICD-10-CM | POA: Diagnosis present

## 2016-05-03 DIAGNOSIS — D259 Leiomyoma of uterus, unspecified: Secondary | ICD-10-CM | POA: Diagnosis present

## 2016-05-03 DIAGNOSIS — Z9079 Acquired absence of other genital organ(s): Secondary | ICD-10-CM

## 2016-05-03 DIAGNOSIS — Z6841 Body Mass Index (BMI) 40.0 and over, adult: Secondary | ICD-10-CM | POA: Diagnosis not present

## 2016-05-03 DIAGNOSIS — N939 Abnormal uterine and vaginal bleeding, unspecified: Secondary | ICD-10-CM | POA: Diagnosis present

## 2016-05-03 DIAGNOSIS — Z90722 Acquired absence of ovaries, bilateral: Secondary | ICD-10-CM

## 2016-05-03 DIAGNOSIS — Z90710 Acquired absence of both cervix and uterus: Secondary | ICD-10-CM

## 2016-05-03 HISTORY — PX: OOPHORECTOMY: SHX6387

## 2016-05-03 HISTORY — PX: ABDOMINAL HYSTERECTOMY: SHX81

## 2016-05-03 HISTORY — PX: BILATERAL SALPINGECTOMY: SHX5743

## 2016-05-03 SURGERY — HYSTERECTOMY, ABDOMINAL
Anesthesia: General | Laterality: Bilateral

## 2016-05-03 MED ORDER — SUCCINYLCHOLINE CHLORIDE 20 MG/ML IJ SOLN
INTRAMUSCULAR | Status: AC
  Filled 2016-05-03: qty 1

## 2016-05-03 MED ORDER — OXYCODONE-ACETAMINOPHEN 5-325 MG PO TABS
1.0000 | ORAL_TABLET | ORAL | Status: DC | PRN
  Administered 2016-05-04: 1 via ORAL
  Filled 2016-05-03: qty 1

## 2016-05-03 MED ORDER — METOCLOPRAMIDE HCL 5 MG/ML IJ SOLN
INTRAMUSCULAR | Status: AC
  Filled 2016-05-03: qty 2

## 2016-05-03 MED ORDER — HYDROCODONE-ACETAMINOPHEN 7.5-325 MG PO TABS: 1.0000 | ORAL_TABLET | Freq: Once | ORAL | Status: DC | PRN

## 2016-05-03 MED ORDER — LIDOCAINE HCL (CARDIAC) 20 MG/ML IV SOLN
INTRAVENOUS | Status: AC
  Filled 2016-05-03: qty 5

## 2016-05-03 MED ORDER — SUCCINYLCHOLINE CHLORIDE 20 MG/ML IJ SOLN
INTRAMUSCULAR | Status: DC | PRN
  Administered 2016-05-03: 140 mg via INTRAVENOUS

## 2016-05-03 MED ORDER — ONDANSETRON HCL 4 MG/2ML IJ SOLN
INTRAMUSCULAR | Status: DC | PRN
  Administered 2016-05-03: 4 mg via INTRAVENOUS

## 2016-05-03 MED ORDER — ONDANSETRON HCL 4 MG/2ML IJ SOLN
4.0000 mg | Freq: Four times a day (QID) | INTRAMUSCULAR | Status: DC | PRN
  Administered 2016-05-03: 4 mg via INTRAVENOUS
  Filled 2016-05-03: qty 2

## 2016-05-03 MED ORDER — MIDAZOLAM HCL 2 MG/2ML IJ SOLN
INTRAMUSCULAR | Status: DC | PRN
  Administered 2016-05-03: 2 mg via INTRAVENOUS

## 2016-05-03 MED ORDER — LIDOCAINE HCL (CARDIAC) 20 MG/ML IV SOLN
INTRAVENOUS | Status: DC | PRN
  Administered 2016-05-03: 100 mg via INTRAVENOUS

## 2016-05-03 MED ORDER — PROPOFOL 10 MG/ML IV BOLUS
INTRAVENOUS | Status: DC | PRN
  Administered 2016-05-03: 250 mg via INTRAVENOUS

## 2016-05-03 MED ORDER — NEOSTIGMINE METHYLSULFATE 10 MG/10ML IV SOLN
INTRAVENOUS | Status: AC
  Filled 2016-05-03: qty 1

## 2016-05-03 MED ORDER — HYDROMORPHONE 1 MG/ML IV SOLN
INTRAVENOUS | Status: DC
  Administered 2016-05-03: 12:00:00 via INTRAVENOUS
  Administered 2016-05-03: 0.6 mg via INTRAVENOUS
  Filled 2016-05-03: qty 25

## 2016-05-03 MED ORDER — NALOXONE HCL 0.4 MG/ML IJ SOLN: 0.4000 mg | INTRAMUSCULAR | Status: DC | PRN

## 2016-05-03 MED ORDER — HYDROMORPHONE HCL 1 MG/ML IJ SOLN
INTRAMUSCULAR | Status: AC
  Filled 2016-05-03: qty 1

## 2016-05-03 MED ORDER — DEXAMETHASONE SODIUM PHOSPHATE 4 MG/ML IJ SOLN
INTRAMUSCULAR | Status: DC | PRN
  Administered 2016-05-03: 4 mg via INTRAVENOUS

## 2016-05-03 MED ORDER — HYDROMORPHONE HCL 1 MG/ML IJ SOLN
0.2500 mg | INTRAMUSCULAR | Status: DC | PRN
  Administered 2016-05-03: 0.25 mg via INTRAVENOUS

## 2016-05-03 MED ORDER — BUPIVACAINE LIPOSOME 1.3 % IJ SUSP
20.0000 mL | Freq: Once | INTRAMUSCULAR | Status: AC
  Administered 2016-05-03: 30 mL
  Filled 2016-05-03: qty 20

## 2016-05-03 MED ORDER — SCOPOLAMINE 1 MG/3DAYS TD PT72
MEDICATED_PATCH | TRANSDERMAL | Status: AC
  Filled 2016-05-03: qty 1

## 2016-05-03 MED ORDER — MENTHOL 3 MG MT LOZG: 1.0000 | LOZENGE | OROMUCOSAL | Status: DC | PRN

## 2016-05-03 MED ORDER — KETOROLAC TROMETHAMINE 30 MG/ML IJ SOLN: 30.0000 mg | Freq: Once | INTRAMUSCULAR | Status: DC | PRN

## 2016-05-03 MED ORDER — FENTANYL CITRATE (PF) 250 MCG/5ML IJ SOLN
INTRAMUSCULAR | Status: AC
  Filled 2016-05-03: qty 5

## 2016-05-03 MED ORDER — SODIUM CHLORIDE 0.9% FLUSH: 9.0000 mL | INTRAVENOUS | Status: DC | PRN

## 2016-05-03 MED ORDER — METOCLOPRAMIDE HCL 5 MG/ML IJ SOLN
INTRAMUSCULAR | Status: DC | PRN
  Administered 2016-05-03: 10 mg via INTRAVENOUS

## 2016-05-03 MED ORDER — FENTANYL CITRATE (PF) 250 MCG/5ML IJ SOLN
INTRAMUSCULAR | Status: DC | PRN
  Administered 2016-05-03: 100 ug via INTRAVENOUS
  Administered 2016-05-03 (×3): 50 ug via INTRAVENOUS

## 2016-05-03 MED ORDER — IBUPROFEN 600 MG PO TABS
600.0000 mg | ORAL_TABLET | Freq: Four times a day (QID) | ORAL | Status: DC | PRN
  Administered 2016-05-04: 600 mg via ORAL
  Filled 2016-05-03: qty 1

## 2016-05-03 MED ORDER — ROCURONIUM BROMIDE 100 MG/10ML IV SOLN
INTRAVENOUS | Status: AC
  Filled 2016-05-03: qty 1

## 2016-05-03 MED ORDER — PROPOFOL 10 MG/ML IV BOLUS
INTRAVENOUS | Status: AC
  Filled 2016-05-03: qty 20

## 2016-05-03 MED ORDER — ROCURONIUM BROMIDE 100 MG/10ML IV SOLN
INTRAVENOUS | Status: DC | PRN
  Administered 2016-05-03: 10 mg via INTRAVENOUS
  Administered 2016-05-03: 40 mg via INTRAVENOUS

## 2016-05-03 MED ORDER — SUGAMMADEX SODIUM 500 MG/5ML IV SOLN
INTRAVENOUS | Status: DC | PRN
  Administered 2016-05-03: 272 mg via INTRAVENOUS

## 2016-05-03 MED ORDER — LACTATED RINGERS IV SOLN
INTRAVENOUS | Status: DC
  Administered 2016-05-03 (×3): via INTRAVENOUS

## 2016-05-03 MED ORDER — DIPHENHYDRAMINE HCL 50 MG/ML IJ SOLN: 12.5000 mg | Freq: Four times a day (QID) | INTRAMUSCULAR | Status: DC | PRN

## 2016-05-03 MED ORDER — BUPIVACAINE HCL 0.25 % IJ SOLN
INTRAMUSCULAR | Status: DC | PRN
  Administered 2016-05-03: 30 mL

## 2016-05-03 MED ORDER — ONDANSETRON HCL 4 MG/2ML IJ SOLN
INTRAMUSCULAR | Status: AC
  Filled 2016-05-03: qty 2

## 2016-05-03 MED ORDER — PROMETHAZINE HCL 25 MG/ML IJ SOLN
INTRAMUSCULAR | Status: AC
  Filled 2016-05-03: qty 1

## 2016-05-03 MED ORDER — DEXAMETHASONE SODIUM PHOSPHATE 10 MG/ML IJ SOLN
INTRAMUSCULAR | Status: AC
  Filled 2016-05-03: qty 1

## 2016-05-03 MED ORDER — MIDAZOLAM HCL 2 MG/2ML IJ SOLN
INTRAMUSCULAR | Status: AC
  Filled 2016-05-03: qty 2

## 2016-05-03 MED ORDER — SCOPOLAMINE 1 MG/3DAYS TD PT72
1.0000 | MEDICATED_PATCH | Freq: Once | TRANSDERMAL | Status: DC
Start: 2016-05-03 — End: 2016-05-03
  Administered 2016-05-03: 1.5 mg via TRANSDERMAL

## 2016-05-03 MED ORDER — DIPHENHYDRAMINE HCL 12.5 MG/5ML PO ELIX: 12.5000 mg | ORAL_SOLUTION | Freq: Four times a day (QID) | ORAL | Status: DC | PRN

## 2016-05-03 MED ORDER — HYDROMORPHONE HCL 1 MG/ML IJ SOLN
INTRAMUSCULAR | Status: DC | PRN
  Administered 2016-05-03: 1 mg via INTRAVENOUS

## 2016-05-03 MED ORDER — PROMETHAZINE HCL 25 MG/ML IJ SOLN: 6.2500 mg | INTRAMUSCULAR | Status: DC | PRN

## 2016-05-03 MED ORDER — HYDROMORPHONE HCL 1 MG/ML IJ SOLN: 0.2000 mg | INTRAMUSCULAR | Status: DC | PRN

## 2016-05-03 MED ORDER — GLYCOPYRROLATE 0.2 MG/ML IJ SOLN
INTRAMUSCULAR | Status: DC | PRN
  Administered 2016-05-03: 0.1 mg via INTRAVENOUS

## 2016-05-03 MED ORDER — DEXTROSE-NACL 5-0.45 % IV SOLN
INTRAVENOUS | Status: DC
Start: 2016-05-03 — End: 2016-05-03
  Administered 2016-05-03: 12:00:00 via INTRAVENOUS

## 2016-05-03 SURGICAL SUPPLY — 44 items
CANISTER SUCT 3000ML (MISCELLANEOUS) ×4 IMPLANT
CLOTH BEACON ORANGE TIMEOUT ST (SAFETY) ×4 IMPLANT
CONT PATH 16OZ SNAP LID 3702 (MISCELLANEOUS) ×2 IMPLANT
DECANTER SPIKE VIAL GLASS SM (MISCELLANEOUS) IMPLANT
DRAPE WARM FLUID 44X44 (DRAPE) ×2 IMPLANT
DRSG OPSITE POSTOP 4X10 (GAUZE/BANDAGES/DRESSINGS) ×4 IMPLANT
DURAPREP 26ML APPLICATOR (WOUND CARE) ×4 IMPLANT
ELECT BLADE 6 FLAT ULTRCLN (ELECTRODE) ×2 IMPLANT
ELECT BLADE 6.5 EXT (BLADE) ×2 IMPLANT
ELECT LIGASURE LONG (ELECTRODE) ×2 IMPLANT
ELECT LIGASURE SHORT 9 REUSE (ELECTRODE) ×2 IMPLANT
GAUZE SPONGE 4X4 16PLY XRAY LF (GAUZE/BANDAGES/DRESSINGS) IMPLANT
GLOVE BIO SURGEON STRL SZ7.5 (GLOVE) ×4 IMPLANT
GLOVE BIOGEL PI IND STRL 7.0 (GLOVE) ×6 IMPLANT
GLOVE BIOGEL PI INDICATOR 7.0 (GLOVE) ×6
GOWN STRL REUS W/TWL LRG LVL3 (GOWN DISPOSABLE) ×4 IMPLANT
HEMOSTAT ARISTA ABSORB 3G PWDR (MISCELLANEOUS) ×2 IMPLANT
NDL SAFETY ECLIPSE 18X1.5 (NEEDLE) IMPLANT
NEEDLE HYPO 18GX1.5 SHARP (NEEDLE) ×4
NS IRRIG 1000ML POUR BTL (IV SOLUTION) ×6 IMPLANT
PACK ABDOMINAL GYN (CUSTOM PROCEDURE TRAY) ×4 IMPLANT
PAD OB MATERNITY 4.3X12.25 (PERSONAL CARE ITEMS) ×4 IMPLANT
PENCIL SMOKE EVAC W/HOLSTER (ELECTROSURGICAL) ×4 IMPLANT
PROTECTOR NERVE ULNAR (MISCELLANEOUS) ×6 IMPLANT
SPONGE LAP 18X18 X RAY DECT (DISPOSABLE) IMPLANT
SPONGE SURGIFOAM ABS GEL 12-7 (HEMOSTASIS) IMPLANT
STAPLER VISISTAT 35W (STAPLE) ×2 IMPLANT
SUT CHROMIC 3 0 SH 27 (SUTURE) IMPLANT
SUT CHROMIC 3 0 TIES (SUTURE) IMPLANT
SUT ETHILON 3 0 FSL (SUTURE) IMPLANT
SUT MNCRL 0 MO-4 VIOLET 18 CR (SUTURE) ×6 IMPLANT
SUT MNCRL 0 VIOLET 6X18 (SUTURE) ×2 IMPLANT
SUT MNCRL AB 0 CT1 27 (SUTURE) ×4 IMPLANT
SUT MON AB 2-0 CT1 27 (SUTURE) IMPLANT
SUT MONOCRYL 0 6X18 (SUTURE) ×2
SUT MONOCRYL 0 MO 4 18  CR/8 (SUTURE) ×2
SUT PDS AB 0 CTX 60 (SUTURE) ×6 IMPLANT
SUT PLAIN 2 0 XLH (SUTURE) ×2 IMPLANT
SUT PROLENE 3 0 FS 2 (SUTURE) IMPLANT
SYR 20CC LL (SYRINGE) ×2 IMPLANT
SYR TB 1ML 25GX5/8 (SYRINGE) ×2 IMPLANT
TOWEL OR 17X24 6PK STRL BLUE (TOWEL DISPOSABLE) ×8 IMPLANT
TRAY FOLEY CATH SILVER 14FR (SET/KITS/TRAYS/PACK) ×4 IMPLANT
WATER STERILE IRR 1000ML POUR (IV SOLUTION) ×2 IMPLANT

## 2016-05-03 NOTE — H&P (Signed)
NAME:  Marissa Marks, Marissa Marks                  ACCOUNT NO.:  [id number]  MEDICAL RECORD NO.:  [id number]  LOCATION:  [facility]                          FACILITY:  [facility]  PHYSICIAN:  Maisie Fus, M.D.   DATE OF BIRTH:  1966/04/26  DATE OF ADMISSION:  05/03/2016 DATE OF DISCHARGE:                             HISTORY & PHYSICAL   ADMITTING DIAGNOSIS:  Multiple uterine leiomyomata, clinical history of dysfunctional uterine bleeding, uncontrolled by oral contraceptives. The patient's request is for definitive surgery.  HISTORY OF PRESENT ILLNESS:  The patient is a 50 year old white married female, gravida 2, para 2, who has been known to have uterine leiomyomata for a [place] time.  Approximately, ______________ she started having difficulty with bleeding in spite of an IUD which was then placed at the time which was a Mirena.  Her bleeding Lysteda to help control the bleeding and she has subsequently had a hysteroscopy D and C with benign pathology but with no improvement in clinical symptoms after that.  She has requested definitive surgery and is admitted now for total abdominal hysterectomy, bilateral salpingo-oophorectomy.  Her current review of systems is remarkable only for a strange episode of upper chest discomfort which dates to May of this year, after which she had a complete evaluation with Dr. Candee Furbish including a normal echocardiogram with no indication of pulmonary ______________ heart dysfunction or evidence of any kind of significant cardiac event.  REVIEW OF SYSTEMS:  Her review of systems is otherwise remarkable only for her GYN complaints of heavy bleeding.  PAST MEDICAL HISTORY:  The patient is known to have had gallbladder disease for which she had a cholecystectomy.  She has no other known significant medical illnesses.  PAST SURGICAL HISTORY:  Previous surgical procedures include the D and C as noted above.  She has had two previous cesarean deliveries.  She has never had a  blood transfusion.  She is not a cigarette smoker.  ALLERGIES:  She has no known drug allergies.  FAMILY HISTORY:  Noncontributory.  PHYSICAL EXAMINATION:  GENERAL:  She is a well developed, markedly obese white female, in no acute distress at the time of examination. NECK AND HEENT:  To my examination, the thyroid gland is not enlarged. VITAL SIGNS:  Blood pressure in the office was 130/98. CHEST:  Clear to auscultation throughout. HEART:  Normal sinus rhythm without murmurs, rubs, or gallops. BREASTS:  Breast exam was considered to be normal.  Recent mammogram was normal. EXTREMITIES:  Without cyanosis, clubbing, or edema. ABDOMEN:  Markedly obese but nontender without appreciable organomegaly. There is no CVA tenderness. PELVIC:  Pelvic examination is compromised by body habitus, but the uterus was noted to be significantly enlarged by ultrasound and by clinical findings with D and C noted above done on ______________.  ASSESSMENT:  Persistent dysfunctional uterine bleeding, marked uterine enlargement with numerous leiomyomata.  PLAN:  Total abdominal hysterectomy.  Bilateral salpingo-oophorectomy.  Appropriate prophylactic measures including PAS hose, IV antibiotic, and early ambulation will be carried out to reduce the risk of complications.     Maisie Fus, M.D.   ______________________________ Viona Gilmore. Evette Cristal, M.D.    WRN/MEDQ  D:  05/02/2016  T:  05/03/2016  Job:  [id number]

## 2016-05-03 NOTE — Transfer of Care (Signed)
Immediate Anesthesia Transfer of Care Note  Patient: Marissa Marks  Procedure(s) Performed: Procedure(s) with comments: HYSTERECTOMY ABDOMINAL (Bilateral) - bilateral salpingoopherectomy  BILATERAL SALPINGECTOMY BILATERAL OOPHORECTOMY  Patient Location: PACU  Anesthesia Type:General  Level of Consciousness: awake, alert  and oriented  Airway & Oxygen Therapy: Patient Spontanous Breathing and Patient connected to face mask oxygen  Post-op Assessment: Report given to RN and Post -op Vital signs reviewed and stable  Post vital signs: Reviewed and stable  Last Vitals:  Filed Vitals:   05/03/16 0629  BP: 148/98  Pulse: 85  Temp: 36.5 C  Resp: 18    Last Pain: There were no vitals filed for this visit.       Complications: No apparent anesthesia complications

## 2016-05-03 NOTE — Anesthesia Postprocedure Evaluation (Signed)
Anesthesia Post Note  Patient: Marissa Marks  Procedure(s) Performed: Procedure(s) (LRB): HYSTERECTOMY ABDOMINAL (Bilateral) BILATERAL SALPINGECTOMY BILATERAL OOPHORECTOMY  Patient location during evaluation: PACU Anesthesia Type: General Level of consciousness: awake and alert Pain management: pain level controlled Vital Signs Assessment: post-procedure vital signs reviewed and stable Respiratory status: spontaneous breathing, nonlabored ventilation, respiratory function stable and patient connected to nasal cannula oxygen Cardiovascular status: blood pressure returned to baseline and stable Postop Assessment: no signs of nausea or vomiting Anesthetic complications: no     Last Vitals:  Filed Vitals:   05/03/16 1200 05/03/16 1213  BP: 148/72   Pulse: 82   Temp:    Resp: 13 13    Last Pain:  Filed Vitals:   05/03/16 1213  PainSc: 5    Pain Goal: Patients Stated Pain Goal: 3 (05/03/16 1115)               Tiajuana Amass

## 2016-05-03 NOTE — Anesthesia Postprocedure Evaluation (Signed)
Anesthesia Post Note  Patient: Marissa Marks  Procedure(s) Performed: Procedure(s) (LRB): HYSTERECTOMY ABDOMINAL (Bilateral) BILATERAL SALPINGECTOMY BILATERAL OOPHORECTOMY  Patient location during evaluation: [facility] Anesthesia Type: General Level of consciousness: awake and alert Pain management: pain level controlled Vital Signs Assessment: post-procedure vital signs reviewed and stable Respiratory status: spontaneous breathing, nonlabored ventilation, respiratory function stable and patient connected to nasal cannula oxygen Cardiovascular status: blood pressure returned to baseline and stable Postop Assessment: no signs of nausea or vomiting Anesthetic complications: no     Last Vitals:  Filed Vitals:   05/03/16 1213 05/03/16 1300  BP:  127/85  Pulse:  84  Temp:  36.8 C  Resp: 13 15    Last Pain:  Filed Vitals:   05/03/16 1329  PainSc: 4    Pain Goal: Patients Stated Pain Goal: 3 (05/03/16 1300)               Riki Sheer

## 2016-05-03 NOTE — Addendum Note (Signed)
Addendum  created 05/03/16 1405 by Riki Sheer, CRNA   Modules edited: Clinical Notes   Clinical Notes:  File: [id number]

## 2016-05-03 NOTE — H&P (Signed)
NAME:  Marissa Marks, Marissa Marks                  ACCOUNT NO.:  [id number]  MEDICAL RECORD NO.:  [id number]  LOCATION:                                 FACILITY:  PHYSICIAN:  Maisie Fus, M.D.   DATE OF BIRTH:  03/14/1966  DATE OF ADMISSION:  05/03/2016 DATE OF DISCHARGE:                             HISTORY & PHYSICAL   ADMITTING DIAGNOSIS:  Multiple uterine leiomyomata, clinical history of dysfunctional uterine bleeding, uncontrolled by oral contraceptives. The patient's request is for definitive surgery.  HISTORY OF PRESENT ILLNESS:  The patient is a 50 year old white married female, gravida 2, para 2, who has been known to have uterine leiomyomata for a [place] time.  Approximately, __________ she started having difficulty with bleeding in spite of an IUD which was then placed at the time which was a Mirena.  Her bleeding Lysteda to help control the bleeding and she has subsequently had a hysteroscopy D and C with benign pathology but with no improvement in clinical symptoms after that.  She has requested definitive surgery and is admitted now for total abdominal hysterectomy, bilateral salpingo-oophorectomy.  Her current review of systems is remarkable only for a strange episode of upper chest discomfort which dates to May of this year, after which she had a complete evaluation with Dr. Candee Furbish including a normal echocardiogram with no indication of pulmonary __________ heart dysfunction or evidence of any kind of significant cardiac event.  REVIEW OF SYSTEMS:  Her review of systems is otherwise remarkable only for her GYN complaints of heavy bleeding.  PAST MEDICAL HISTORY:  The patient is known to have had gallbladder disease for which she had a cholecystectomy.  She has no other known significant medical illnesses.  PAST SURGICAL HISTORY:  Previous surgical procedures include the D and C as noted above.  She has had two previous cesarean deliveries.  She has never had a blood  transfusion.  She is not a cigarette smoker.  ALLERGIES:  She has no known drug allergies.  FAMILY HISTORY:  Noncontributory.  PHYSICAL EXAMINATION:  GENERAL:  She is a well developed, markedly obese white female, in no acute distress at the time of examination. NECK AND HEENT:  To my examination, the thyroid gland is not enlarged. VITAL SIGNS:  Blood pressure in the office was 130/98. CHEST:  Clear to auscultation throughout. HEART:  Normal sinus rhythm without murmurs, rubs, or gallops. BREASTS:  Breast exam was considered to be normal.  Recent mammogram was normal. EXTREMITIES:  Without cyanosis, clubbing, or edema. ABDOMEN:  Markedly obese but nontender without appreciable organomegaly. There is no CVA tenderness. PELVIC:  Pelvic examination is compromised by body habitus, but the uterus was noted to be significantly enlarged by ultrasound and by clinical findings with D and C noted above done on __________.  ASSESSMENT:  Persistent dysfunctional uterine bleeding, marked uterine enlargement with numerous leiomyomata.  PLAN:  Total abdominal hysterectomy.  Bilateral salpingo-oophorectomy.  Appropriate prophylactic measures including PAS hose, IV antibiotic, and early ambulation will be carried out to reduce the risk of complications.     Maisie Fus, M.D.   ______________________________ Viona Gilmore. Evette Cristal, M.D.    WRN/MEDQ  D:  05/02/2016  T:  05/02/2016  Job:  [id number]

## 2016-05-03 NOTE — Anesthesia Procedure Notes (Signed)
Procedure Name: Intubation Date/Time: 05/03/2016 7:56 AM Performed by: Flossie Dibble Pre-anesthesia Checklist: Patient being monitored, Suction available, Emergency Drugs available, Patient identified and Timeout performed Patient Re-evaluated:Patient Re-evaluated prior to inductionOxygen Delivery Method: Circle system utilized Preoxygenation: Pre-oxygenation with 100% oxygen Intubation Type: IV induction and Cricoid Pressure applied Ventilation: Oral airway inserted - appropriate to patient size and Mask ventilation without difficulty Laryngoscope Size: Glidescope and 3 Grade View: Grade I Tube type: Oral Tube size: 7.0 mm Number of attempts: 1 Airway Equipment and Method: Patient positioned with wedge pillow,  Video-laryngoscopy and Stylet Placement Confirmation: ETT inserted through vocal cords under direct vision,  positive ETCO2 and breath sounds checked- equal and bilateral Secured at: 22 cm Tube secured with: Tape Dental Injury: Teeth and Oropharynx as per pre-operative assessment  Difficulty Due To: Difficulty was anticipated, Difficult Airway- due to large tongue, Difficult Airway- due to limited oral opening and Difficult Airway- due to anterior larynx

## 2016-05-04 ENCOUNTER — Encounter (HOSPITAL_COMMUNITY): Payer: Self-pay | Admitting: Obstetrics & Gynecology

## 2016-05-04 LAB — CBC
HEMATOCRIT: 32.8 % — AB (ref 36.0–46.0)
HEMOGLOBIN: 10.6 g/dL — AB (ref 12.0–15.0)
MCH: 29.2 pg (ref 26.0–34.0)
MCHC: 32.3 g/dL (ref 30.0–36.0)
MCV: 90.4 fL (ref 78.0–100.0)
Platelets: 215 10*3/uL (ref 150–400)
RBC: 3.63 MIL/uL — ABNORMAL LOW (ref 3.87–5.11)
RDW: 14.1 % (ref 11.5–15.5)
WBC: 13.8 10*3/uL — ABNORMAL HIGH (ref 4.0–10.5)

## 2016-05-04 NOTE — Progress Notes (Signed)

## 2016-05-04 NOTE — Op Note (Signed)
NAME:  Marissa Marks, Marissa Marks                  ACCOUNT NO.:  [id number]  MEDICAL RECORD NO.:  [id number]  LOCATION:  [id number]                          FACILITY:  [facility]  PHYSICIAN:  Maisie Fus, M.D.   DATE OF BIRTH:  1966-07-05  DATE OF PROCEDURE:  05/03/2016 DATE OF DISCHARGE:                              OPERATIVE REPORT   PREOPERATIVE DIAGNOSIS:  Multiple uterine leiomyomata, history of chronic dysfunctional uterine bleeding.  POSTOPERATIVE DIAGNOSIS:  Multiple uterine leiomyomata, history of chronic dysfunctional uterine bleeding with minimal evidence of pelvic endometriosis, and left adnexal adhesions.  OPERATIVE PROCEDURE:  Total abdominal hysterectomy, bilateral salpingo- oophorectomy.  SURGEON:  Maisie Fus, M.D.  ASSISTANT:  ANESTHESIA:  General endotracheal.  ESTIMATED INTRAOPERATIVE BLOOD LOSS:  350 mL.  INTRAOPERATIVE COMPLICATIONS:  None.  DESCRIPTION OF PROCEDURE:  Details of the present illness were recorded in the admission note.  The patient was admitted on the morning for surgery.  She received a bolus of Ancef.  She was placed in PAS hose. She was brought to the operating room, there placed under adequate general endotracheal anesthesia, placed in the dorsal recumbent position on the operating table.  The abdomen was prepped in usual fashion. Vagina was prepped in usual fashion.  Foley placed using sterile technique.  Sterile drapes were then applied.  A lower abdominal transverse incision was made approximately 1 cm above the old cesarean scar which was in a skin fold.  This was carried sharply down the fascia, and the fascia entered sharply and extended to the external skin incision.  Rectus sheath was developed inferiorly and superiorly with blunt and sharp dissection.  Peritoneum was visualized midline, elevated, entered sharply and extended bluntly to the extent of skin incision.  The pelvis was filled with a fibroid uterus.  Initial attempt to elevate it  into the operative field was not completely successful. For that reason, a O'Sullivan-O'Connor retractor was placed.  Moist packs were used to pack the intestinal contents out of the pelvis. Proximal broad ligaments were clamped with Kelly clamps.  The utero- ovarian part of the infundibulopelvic ligament on the right side was isolated, sealed with LigaSure and divided sharply.  The round ligament was similarly treated.  Upper broad ligament was then progressively sealed and divided.  This was carried down to a level just above the uterine arteries.  The left side was then treated essentially identically.  The bladder flap was then released from the lower segment and bladder bluntly and sharply dissected off the lower segment. Sterile pedicles were developed using the LigaSure device at the level of the uterosacrals on each side.  Uterosacrals were clamped with curved Heaney's, divided sharply and ligated with suture ligature of 0 Monocryl in a Heaney fashion.  This effectively angled the vagina to the uterosacrals.  The uterus was separated from the cervix for development of these lateral pedicles to allow adequate exposure.  The cervix was then completely removed.  The cuff was closed with figure-of-eights of 0 Monocryl.  The appendix was visualized and was found to be normal. Irrigation was carried out.  Topical thrombotic cornstarch was applied to the cul-de-sac for complete hemostasis.  With all  pack, needle, and instrument counts correct, the abdominal incision was closed in layers; running 0 Monocryl was used to close the peritoneum and reapproximate the rectus muscle, fascia was closed with running suture of double 0 PDS.  Skin was closed with subcutaneous suture of 3-0 plain.  The skin was closed with skin staples.  Please note that a mixture of 0.25% Marcaine and Exparel were mixed and injected into the fascia and into the skin before complete closure.  Sterile drapes were  applied after closure of the skin with skin staples.  Hemostasis was noted to be adequate.     Maisie Fus, M.D.     WRN/MEDQ  D:  05/03/2016  T:  05/04/2016  Job:  [id number]

## 2016-05-04 NOTE — Progress Notes (Signed)
S: No complaints  Ambulating without difficulty, + flatus. Has voided this am O: VSS Abd soft + BS,  Honeycomb dressing with small drainage noted on left margin of incision Labs reviewed, stable  A: S/p TAH, BSO P: Discharge later today. Reviewed post op instructions

## 2016-05-05 NOTE — Discharge Summary (Signed)
Discharge Summary Reason for Admission: surgery- TAH, BSO HEMOGLOBIN  Date Value Ref Range Status  05/04/2016 10.6* 12.0 - 15.0 g/dL Final   HCT  Date Value Ref Range Status  05/04/2016 32.8* 36.0 - 46.0 % Final    Physical Exam:  General: alert and cooperative Incision: healing well DVT Evaluation: No evidence of DVT seen on physical exam. Negative Homan's sign. No cords or calf tenderness. Calf/Ankle edema is present.  Discharge Diagnoses: Uterine fibroids  Discharge Information: Date: 05/05/2016 Activity: pelvic rest Diet: routine Medications: Percocet Condition: stable Instructions: No vaginal entry , no heavy lifting, no driving for 2 weeks. To call for fever , vomiting or vag bleeding Discharge to: home       Marissa Marks G 05/05/2016, 8:55 AM

## 2017-04-14 ENCOUNTER — Encounter: Payer: Self-pay | Admitting: Gastroenterology

## 2017-05-31 ENCOUNTER — Other Ambulatory Visit: Payer: Self-pay | Admitting: Dermatology

## 2018-11-21 HISTORY — PX: COLONOSCOPY: SHX174

## 2019-02-19 ENCOUNTER — Encounter (HOSPITAL_COMMUNITY): Payer: Self-pay

## 2019-02-19 ENCOUNTER — Emergency Department (HOSPITAL_COMMUNITY): Payer: BC Managed Care – PPO

## 2019-02-19 ENCOUNTER — Emergency Department (HOSPITAL_COMMUNITY)
Admission: EM | Admit: 2019-02-19 | Discharge: 2019-02-19 | Disposition: A | Discharge status: Home / Self Care | Payer: BC Managed Care – PPO | Attending: Emergency Medicine | Admitting: Emergency Medicine

## 2019-02-19 ENCOUNTER — Other Ambulatory Visit: Payer: Self-pay

## 2019-02-19 DIAGNOSIS — R0781 Pleurodynia: Secondary | ICD-10-CM | POA: Insufficient documentation

## 2019-02-19 DIAGNOSIS — Z79899 Other long term (current) drug therapy: Secondary | ICD-10-CM | POA: Diagnosis not present

## 2019-02-19 DIAGNOSIS — I10 Essential (primary) hypertension: Secondary | ICD-10-CM | POA: Diagnosis not present

## 2019-02-19 DIAGNOSIS — R079 Chest pain, unspecified: Secondary | ICD-10-CM | POA: Diagnosis present

## 2019-02-19 DIAGNOSIS — Z7982 Long term (current) use of aspirin: Secondary | ICD-10-CM | POA: Diagnosis not present

## 2019-02-19 LAB — CBC WITH DIFFERENTIAL/PLATELET
ABS IMMATURE GRANULOCYTES: 0.01 10*3/uL (ref 0.00–0.07)
BASOS ABS: 0 10*3/uL (ref 0.0–0.1)
BASOS PCT: 0 %
Eosinophils Absolute: 0.1 10*3/uL (ref 0.0–0.5)
Eosinophils Relative: 2 %
HCT: 37.3 % (ref 36.0–46.0)
HEMOGLOBIN: 11.7 g/dL — AB (ref 12.0–15.0)
IMMATURE GRANULOCYTES: 0 %
LYMPHS PCT: 38 %
Lymphs Abs: 2.3 10*3/uL (ref 0.7–4.0)
MCH: 28.7 pg (ref 26.0–34.0)
MCHC: 31.4 g/dL (ref 30.0–36.0)
MCV: 91.4 fL (ref 80.0–100.0)
MONO ABS: 0.6 10*3/uL (ref 0.1–1.0)
Monocytes Relative: 9 %
NEUTROS ABS: 3 10*3/uL (ref 1.7–7.7)
NEUTROS PCT: 51 %
NRBC: 0 % (ref 0.0–0.2)
PLATELETS: 206 10*3/uL (ref 150–400)
RBC: 4.08 MIL/uL (ref 3.87–5.11)
RDW: 13.2 % (ref 11.5–15.5)
WBC: 6 10*3/uL (ref 4.0–10.5)

## 2019-02-19 LAB — COMPREHENSIVE METABOLIC PANEL
ALT: 16 U/L (ref 0–44)
ANION GAP: 7 (ref 5–15)
AST: 17 U/L (ref 15–41)
Albumin: 3.2 g/dL — ABNORMAL LOW (ref 3.5–5.0)
Alkaline Phosphatase: 85 U/L (ref 38–126)
BILIRUBIN TOTAL: 0.3 mg/dL (ref 0.3–1.2)
BUN: 16 mg/dL (ref 6–20)
CHLORIDE: 109 mmol/L (ref 98–111)
CO2: 24 mmol/L (ref 22–32)
Calcium: 8.7 mg/dL — ABNORMAL LOW (ref 8.9–10.3)
Creatinine, Ser: 0.69 mg/dL (ref 0.44–1.00)
Glucose, Bld: 134 mg/dL — ABNORMAL HIGH (ref 70–99)
POTASSIUM: 3.7 mmol/L (ref 3.5–5.1)
Sodium: 140 mmol/L (ref 135–145)
TOTAL PROTEIN: 6.5 g/dL (ref 6.5–8.1)

## 2019-02-19 LAB — D-DIMER, QUANTITATIVE (NOT AT ARMC): D DIMER QUANT: 1.18 ug{FEU}/mL — AB (ref 0.00–0.50)

## 2019-02-19 LAB — TROPONIN I: Troponin I: <0.03 ng/mL (ref <0.03)

## 2019-02-19 MED ORDER — IOHEXOL 350 MG/ML SOLN
100.0000 mL | Freq: Once | INTRAVENOUS | Status: AC | PRN
  Administered 2019-02-19: 100 mL via INTRAVENOUS

## 2019-02-19 MED ORDER — NITROGLYCERIN 2 % TD OINT
1.0000 [in_us] | TOPICAL_OINTMENT | Freq: Once | TRANSDERMAL | Status: AC
Start: 2019-02-19 — End: 2019-02-19
  Administered 2019-02-19: 1 [in_us] via TOPICAL
  Filled 2019-02-19: qty 1

## 2019-02-19 NOTE — Discharge Instructions (Addendum)
Take ibuprofen 600 mg 4 times a day OR Aleve 2 tablets twice a day for your chest pain which appears to be pleurisy.  Recheck if you get a fever, cough, struggle to breathe, or seem worse.  Consider seeing Dr. Marlou Porch again to evaluate you for this chest pain due to your family history of significant heart disease.  Recheck if you get worse.

## 2019-02-19 NOTE — ED Triage Notes (Signed)
To c/o cp in center chest that goes to left shoulder and back and arm.  Took 2 ntg with ems and a 234 asa with ems cp relieved. vss and ekg normal with ems, 20g r ac.   No cardiac history otherwise. Only hurts when she takes a deep breath.

## 2019-02-19 NOTE — ED Notes (Signed)
Lab in room.

## 2019-02-19 NOTE — ED Provider Notes (Signed)
[facility] EMERGENCY DEPARTMENT Provider Note   CSN: [id number] Arrival date & time: 02/19/19  0103  Time seen 1:12 AM.  History   Chief Complaint Chief Complaint  Patient presents with   Chest Pain    HPI Marissa Marks is a 53 y.o. female.     HPI patient states about 52 tonight she was sitting watching game shows and had gradual onset of left-sided chest/breast pain that radiates into her left shoulder blade that got gradually worse.  She states it is not a pressure sensation but is a feeling of tightness like she could not move.  She denies nausea or vomiting but states she did have some mild nausea in the ambulance.  She states she got sweaty when EMS arrived at her house.  She denies shortness of breath, swelling or pain in her lower extremities, cough, rhinorrhea, sneezing, sore throat, or fever.  She states however on Saturday, the 28th she started having some diarrhea a couple times a day that is watery with some chunks in it.  She states the pain has been there constantly.  She states taking a big deep breath feels like there is a pulling sensation in her chest.  She states nothing she does makes the pain feel better but the nitroglycerin EMS helped with her pain a lot.  She states at its worst it was a 5-6 on a scale of 0-10 and currently is a 1-2.  Patient denies ever smoking, she was treated for hypertension briefly but not now.  She does have significant family history for heart disease.  Her father died in 11-18-2023 at age 79 and he had stents, MIs, congestive heart failure and CABG.  Her mother has Parkinson's.  Her paternal grandfather and paternal grandmother had MIs, her maternal grandfather had MIs.  She has been on Premarin since she had a hysterectomy in June 2017.  She denies being sedentary at this time.   PCP .Bradly Bienenstock at [facility]  Past Medical History:  Diagnosis Date   Anemia    history of   Arthritis    Chronic cholecystitis with calculus  10/29/2012   Hand numbness    occ   Heart palpitations    Hypercholesterolemia    Hypertension    no longer taking medication for one year   PONV (postoperative nausea and vomiting)     Patient Active Problem List   Diagnosis Date Noted   S/P TAH-BSO 05/03/2016   Hypertension    Heart palpitations    Hypercholesterolemia    Chronic cholecystitis with calculus 10/29/2012    Past Surgical History:  Procedure Laterality Date   ABDOMINAL HYSTERECTOMY Bilateral 05/03/2016   Procedure: HYSTERECTOMY ABDOMINAL;  Surgeon: Maisie Fus, MD;  Location: [facility];  Service: Gynecology;  Laterality: Bilateral;  bilateral salpingoopherectomy    BILATERAL SALPINGECTOMY  05/03/2016   Procedure: BILATERAL SALPINGECTOMY;  Surgeon: Maisie Fus, MD;  Location: [facility];  Service: Gynecology;;   CESAREAN SECTION  11/10/86 09/06/88   x2   CHOLECYSTECTOMY  11/06/2012   Procedure: LAPAROSCOPIC CHOLECYSTECTOMY WITH INTRAOPERATIVE CHOLANGIOGRAM;  Surgeon: Imogene Burn. Georgette Dover, MD;  Location: [facility];  Service: General;  Laterality: N/A;   [place]     childhood   OOPHORECTOMY  05/03/2016   Procedure: BILATERAL OOPHORECTOMY;  Surgeon: Maisie Fus, MD;  Location: [facility];  Service: Gynecology;;     Cyndie Mull History  No obstetric history on file.      Home Medications    States she takes a baby aspirin a day, vitamin D3, and Premarin  Prior to Admission medications   Medication Sig Start Date End Date Taking? Authorizing Provider  aspirin EC 81 MG tablet Take 81 mg by mouth daily. Reported on 03/16/2016    [provider]  Cholecalciferol (VITAMIN D PO) Take 2,000 Units by mouth daily. Reported on 03/16/2016    [provider]  fluticasone [facility] ALLERGY RELIEF) 50 MCG/ACT nasal spray Place 1 spray into both nostrils daily. Reported on 03/16/2016    [provider]  loratadine (CLARITIN) 10 MG tablet Take 10  mg by mouth daily. Reported on 03/16/2016    [provider]    Family History Family History  Problem Relation Age of Onset   Heart disease Father    Parkinson's disease Mother    Heart disease Maternal Grandmother    Heart disease Maternal Grandfather    Heart disease Paternal Grandmother    Ovarian cancer Paternal Grandmother    Heart disease Paternal Grandfather     Social History Social History   Tobacco Use   Smoking status: Never Smoker   Smokeless tobacco: Never Used  Substance Use Topics   Alcohol use: No   Drug use: No     Allergies   Patient has no known allergies.   Review of Systems Review of Systems  All other systems reviewed and are negative.    Physical Exam Updated Vital Signs BP (!) 154/93    Pulse 79    Temp 98.4 F (36.9 C) (Oral)    Resp 19    Ht 5\' 6"  (1.676 m)    Wt 131.5 kg    LMP 04/14/2016 (Exact Date) Comment: still bleeding   SpO2 95%    BMI 46.81 kg/m   Vital signs normal    Physical Exam Vitals signs and nursing note reviewed.  Constitutional:      Appearance: She is well-developed. She is obese.  HENT:     Head: Normocephalic and atraumatic.     Right Ear: External ear normal.     Left Ear: External ear normal.     Nose: Nose normal.     Mouth/Throat:     Mouth: Mucous membranes are moist.     Pharynx: No oropharyngeal exudate or posterior oropharyngeal erythema.  Eyes:     Extraocular Movements: Extraocular movements intact.     Conjunctiva/sclera: Conjunctivae normal.     Pupils: Pupils are equal, round, and reactive to light.  Neck:     Musculoskeletal: Normal range of motion.  Cardiovascular:     Rate and Rhythm: Normal rate and regular rhythm.  Pulmonary:     Effort: Pulmonary effort is normal. No respiratory distress.     Breath sounds: Normal breath sounds.  Chest:     Chest wall: No tenderness.     Comments: Heart sounds distant due to thickness of chest wall Abdominal:     General:  Abdomen is flat. Bowel sounds are normal.     Palpations: Abdomen is soft.     Tenderness: There is no abdominal tenderness.  Musculoskeletal: Normal range of motion.        General: No swelling, tenderness or deformity.  Skin:    General: Skin is warm and dry.     Findings: No rash.  Neurological:     General: No focal deficit present.     Mental Status:  She is alert and oriented to person, place, and time. Mental status is at baseline.     Cranial Nerves: No cranial nerve deficit.  Psychiatric:        Mood and Affect: Mood normal.        Behavior: Behavior normal.        Thought Content: Thought content normal.      ED Treatments / Results  Labs (all labs ordered are listed, but only abnormal results are displayed) Results for orders placed or performed during the hospital encounter of 02/19/19  Comprehensive metabolic panel  Result Value Ref Range   Sodium 140 135 - 145 mmol/L   Potassium 3.7 3.5 - 5.1 mmol/L   Chloride 109 98 - 111 mmol/L   CO2 24 22 - 32 mmol/L   Glucose, Bld 134 (H) 70 - 99 mg/dL   BUN 16 6 - 20 mg/dL   Creatinine, Ser 0.69 0.44 - 1.00 mg/dL   Calcium 8.7 (L) 8.9 - 10.3 mg/dL   Total Protein 6.5 6.5 - 8.1 g/dL   Albumin 3.2 (L) 3.5 - 5.0 g/dL   AST 17 15 - 41 U/L   ALT 16 0 - 44 U/L   Alkaline Phosphatase 85 38 - 126 U/L   Total Bilirubin 0.3 0.3 - 1.2 mg/dL   GFR calc non Af Amer >60 >60 mL/min   GFR calc Af Amer >60 >60 mL/min   Anion gap 7 5 - 15  CBC with Differential  Result Value Ref Range   WBC 6.0 4.0 - 10.5 K/uL   RBC 4.08 3.87 - 5.11 MIL/uL   Hemoglobin 11.7 (L) 12.0 - 15.0 g/dL   HCT 37.3 36.0 - 46.0 %   MCV 91.4 80.0 - 100.0 fL   MCH 28.7 26.0 - 34.0 pg   MCHC 31.4 30.0 - 36.0 g/dL   RDW 13.2 11.5 - 15.5 %   Platelets 206 150 - 400 K/uL   nRBC 0.0 0.0 - 0.2 %   Neutrophils Relative % 51 %   Neutro Abs 3.0 1.7 - 7.7 K/uL   Lymphocytes Relative 38 %   Lymphs Abs 2.3 0.7 - 4.0 K/uL   Monocytes Relative 9 %   Monocytes  Absolute 0.6 0.1 - 1.0 K/uL   Eosinophils Relative 2 %   Eosinophils Absolute 0.1 0.0 - 0.5 K/uL   Basophils Relative 0 %   Basophils Absolute 0.0 0.0 - 0.1 K/uL   Immature Granulocytes 0 %   Abs Immature Granulocytes 0.01 0.00 - 0.07 K/uL  D-dimer, quantitative  Result Value Ref Range   D-Dimer, Quant 1.18 (H) 0.00 - 0.50 ug/mL-FEU  Troponin I - Once  Result Value Ref Range   Troponin I <0.03 <0.03 ng/mL  Troponin I - Once  Result Value Ref Range   Troponin I <0.03 <0.03 ng/mL   Laboratory interpretation all normal except positive d-dimer    EKG EKG Interpretation  Date/Time:  Tuesday February 19 2019 01:23:55 EDT Ventricular Rate:  78 PR Interval:    QRS Duration: 101 QT Interval:  426 QTC Calculation: 486 R Axis:   -4 Text Interpretation:  Age not entered, assumed to be  53 years old for purpose of ECG interpretation Sinus rhythm Borderline prolonged QT interval Electrode noise No significant change since last tracing 12 Oct 2012 Confirmed by Rolland Porter [phone]) on 02/19/2019 1:30:04 AM   Radiology Dg Chest 2 View  Result Date: 02/19/2019 CLINICAL DATA:  Mid chest pain radiating to LEFT upper extremity  and back. History of hypertension. EXAM: CHEST - 2 VIEW COMPARISON:  Chest radiograph October 12, 2012 FINDINGS: Cardiomediastinal silhouette is normal. No pleural effusions or focal consolidations. Trachea projects midline and there is no pneumothorax. Soft tissue planes and included osseous structures are non-suspicious. IMPRESSION: No acute cardiopulmonary process. Electronically Signed   By: Elon Alas M.D.   On: 02/19/2019 02:04   Ct Angio Chest Pe W/cm &/or Wo Cm  Result Date: 02/19/2019 CLINICAL DATA:  Chest pain.  Left shoulder, back and arm pain. EXAM: CT ANGIOGRAPHY CHEST WITH CONTRAST TECHNIQUE: Multidetector CT imaging of the chest was performed using the standard protocol during bolus administration of intravenous contrast. Multiplanar CT image  reconstructions and MIPs were obtained to evaluate the vascular anatomy. CONTRAST:  180mL OMNIPAQUE IOHEXOL 350 MG/ML SOLN COMPARISON:  11/12/2012 FINDINGS: Cardiovascular:  Cardiac enlargement.  No pericardial effusion. The main pulmonary artery is patent. No saddle embolus. No central obstructing pulmonary emboli identified. No lobar or segmental pulmonary artery filling defects identified. Mediastinum/Nodes: No enlarged mediastinal, hilar, or axillary lymph nodes. Thyroid gland, trachea, and esophagus demonstrate no significant findings. Lungs/Pleura: No pleural effusion. No airspace consolidation. Subsegmental atelectasis within the lingula noted. 2 mm left lower lobe lung nodule is identified, image 61/6. New from previous exam. Calcified granuloma also noted in the left lower lobe. Upper Abdomen: No acute findings within the visualized portions of the upper abdomen. Musculoskeletal: No chest wall abnormality. No acute or significant osseous findings. Review of the MIP images confirms the above findings. IMPRESSION: 1. No acute cardiopulmonary abnormalities. No evidence for acute pulmonary embolus. 2. 2 mm left lower lobe lung nodule, new from previous exam. No follow-up needed if patient is low-risk. Non-contrast chest CT can be considered in 12 months if patient is high-risk. This recommendation follows the consensus statement: Guidelines for Management of Incidental Pulmonary Nodules Detected on CT Images: From the Fleischner Society 2017; Radiology 2017; 284:228-243. Electronically Signed   By: Kerby Moors M.D.   On: 02/19/2019 03:16    Procedures Procedures (including critical care time)  Medications Ordered in ED Medications  nitroGLYCERIN (NITROGLYN) 2 % ointment 1 inch (1 inch Topical Given 02/19/19 0135)  iohexol (OMNIPAQUE) 350 MG/ML injection 100 mL (100 mLs Intravenous Contrast Given 02/19/19 0253)     Initial Impression / Assessment and Plan / ED Course  I have reviewed the triage  vital signs and the nursing notes.  Pertinent labs & imaging results that were available during my care of the patient were reviewed by me and considered in my medical decision making (see chart for details).        Patient has never had chest pain before and her risk factors are family history and obesity.  Her pain was improved with nitroglycerin given by EMS.  Nitroglycerin paste was applied to her chest while waiting for her test results.  Her EKG is without acute findings.  D-dimer was done to rule out PE.  Patient's d-dimer was elevated and CTA of the chest was ordered to look for PE.  Patient states her pain is almost gone after the nitroglycerin paste.  We discussed her CTA was negative for PE or other acute problem except there was a new lung nodule.  She can follow-up of her primary care doctor to see if she needs another scan in a year.  Delta troponin was done to evaluate her for coronary artery disease.  She has a strong family history of coronary artery disease.  Patient's delta troponin  is negative.  At time of discharge patient states her pain is gone.  I think her pain is possibly pleurisy since it had a pleuritic component.  PE and cardiac disease was ruled out tonight.  She can take anti-inflammatory for pain at home.  She has seen Dr. Marlou Porch in the past when she had chest pain which was many years ago.  She was given his number again to have him reevaluate her.  She does have a very strong family history of heart disease.    Final Clinical Impressions(s) / ED Diagnoses   Final diagnoses:  Pleuritic chest pain    ED Discharge Orders    None    OTC motrin OR aleve  Plan discharge  Rolland Porter, MD, Barbette Or, MD 02/19/19 [phone]

## 2019-08-30 ENCOUNTER — Other Ambulatory Visit: Payer: Self-pay

## 2019-08-30 DIAGNOSIS — Z20822 Contact with and (suspected) exposure to covid-19: Secondary | ICD-10-CM

## 2019-09-01 LAB — NOVEL CORONAVIRUS, NAA: SARS-CoV-2, NAA: NOT DETECTED

## 2020-02-14 ENCOUNTER — Ambulatory Visit: Payer: BC Managed Care – PPO | Attending: Internal Medicine

## 2020-02-14 DIAGNOSIS — Z23 Encounter for immunization: Secondary | ICD-10-CM

## 2020-02-14 NOTE — Progress Notes (Signed)
   Covid-19 Vaccination Clinic  Name:  Marissa Marks    MRN: [id number] DOB: 1966-06-15  02/14/2020  [name] was observed post Covid-19 immunization for 15 minutes without incident. She was provided with Vaccine Information Sheet and instruction to access the V-Safe system.   [name] was instructed to call 911 with any severe reactions post vaccine: [name] Kitchen Difficulty breathing  . Swelling of face and throat  . A fast heartbeat  . A bad rash all over body  . Dizziness and weakness   Immunizations Administered    Name Date Dose VIS Date Route   Moderna COVID-19 Vaccine 02/14/2020  8:33 AM 0.5 mL 10/22/2019 Intramuscular   Manufacturer: Moderna   Lot: [id number]   [id number]

## 2020-03-18 ENCOUNTER — Ambulatory Visit: Payer: BC Managed Care – PPO | Attending: Internal Medicine

## 2020-03-18 DIAGNOSIS — Z23 Encounter for immunization: Secondary | ICD-10-CM

## 2020-03-18 NOTE — Progress Notes (Signed)
   Covid-19 Vaccination Clinic  Name:  Marissa Marks    MRN: [id number] DOB: Jul 25, 1966  03/18/2020  Marissa Marks was observed post Covid-19 immunization for 15 minutes without incident. She was provided with Vaccine Information Sheet and instruction to access the V-Safe system.   Marissa Marks was instructed to call 911 with any severe reactions post vaccine: [name] Kitchen Difficulty breathing  . Swelling of face and throat  . A fast heartbeat  . A bad rash all over body  . Dizziness and weakness   Immunizations Administered    Name Date Dose VIS Date Route   Moderna COVID-19 Vaccine 03/18/2020  8:59 AM 0.5 mL 10/2019 Intramuscular   Manufacturer: Moderna   Lot: [id number]   [id number]

## 2021-02-21 IMAGING — CT CT ANGIOGRAPHY CHEST
2 of 6 series · 17 of 46 positions shown · IV contrast (Isovue)
Comparison: 11/12/2012

CLINICAL DATA: Chest pain.  Left shoulder, back and arm pain.

EXAM:
CT ANGIOGRAPHY CHEST WITH CONTRAST
TECHNIQUE: Multidetector CT imaging of the chest was performed using the
standard protocol during bolus administration of intravenous
contrast. Multiplanar CT image reconstructions and MIPs were
obtained to evaluate the vascular anatomy.
CONTRAST:  100mL OMNIPAQUE IOHEXOL 350 MG/ML SOLN

[Series 5: thins · axial · 0.64mm/px · z∈[+1439,+1625]mm · 14 of 204 slices shown]
[im 9/204  lung]
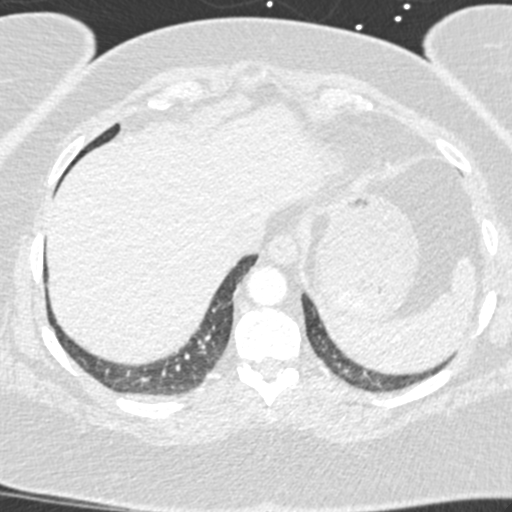
[im 27/204  soft-tissue]
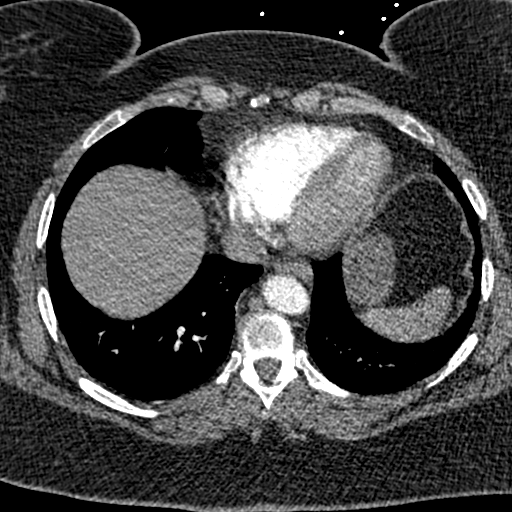
[im 36/204  lung]
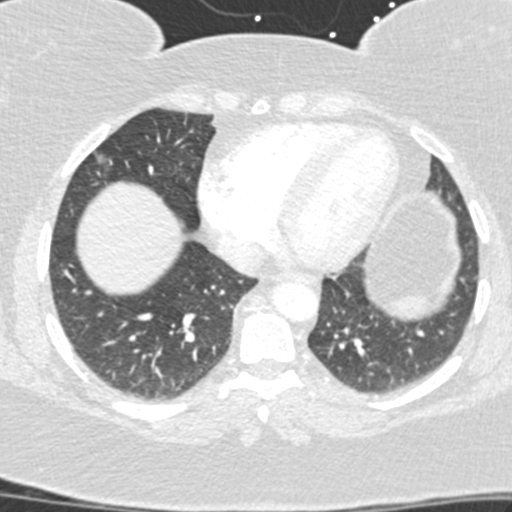
[im 53/204  soft-tissue]
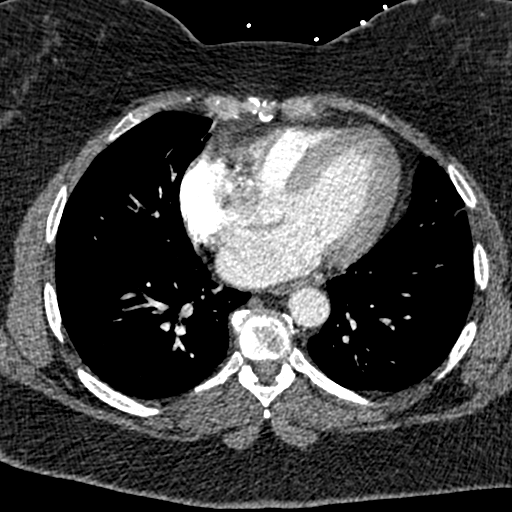
[im 71/204  lung]
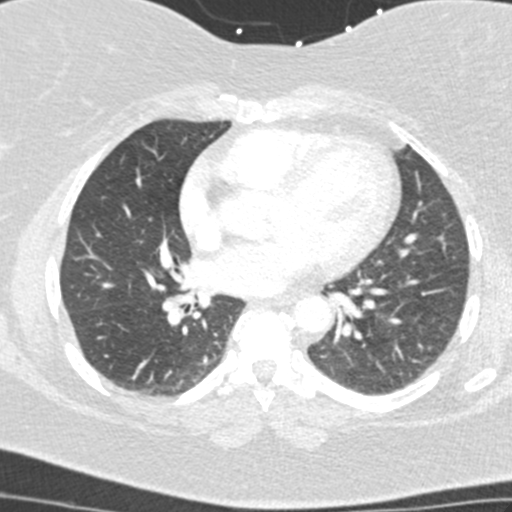
[im 80/204  soft-tissue]
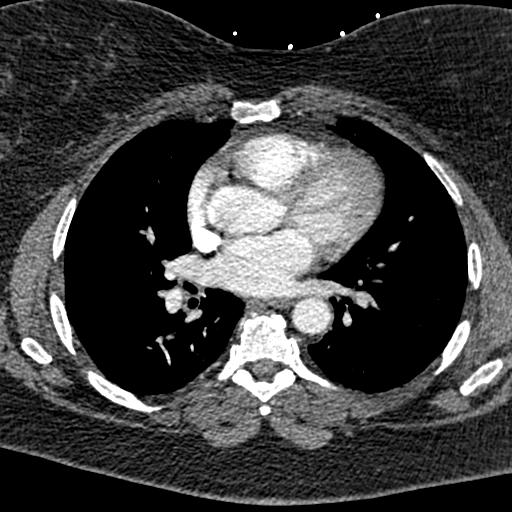
[im 98/204  lung]
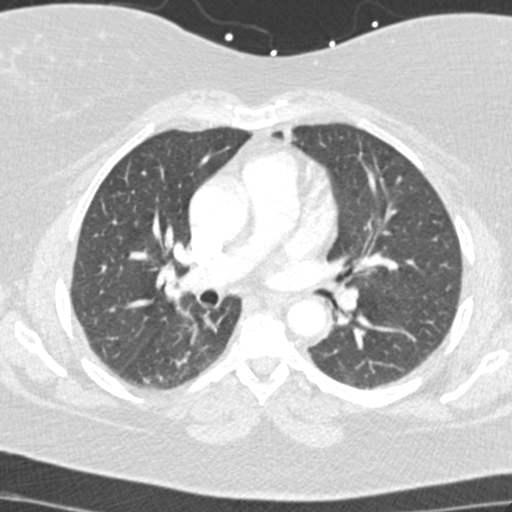
[im 106/204  soft-tissue]
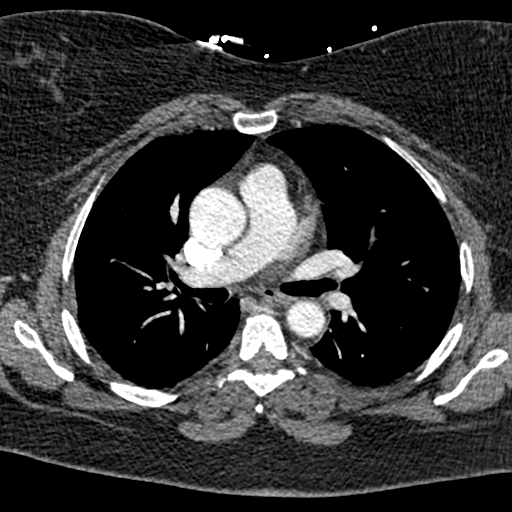
[im 124/204  lung]
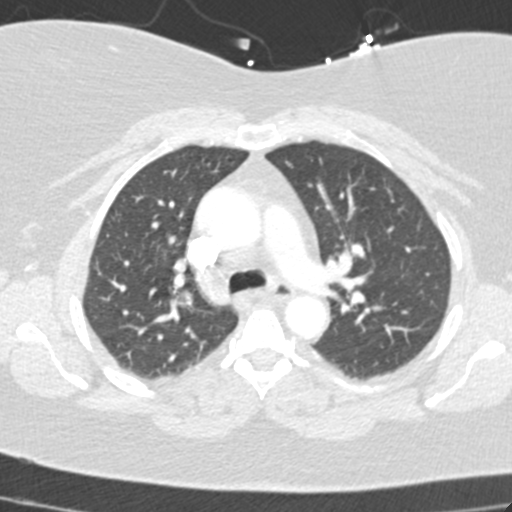
[im 133/204  soft-tissue]
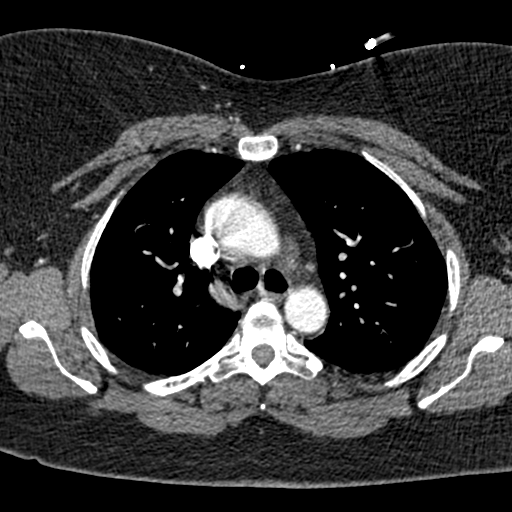
[im 151/204  lung]
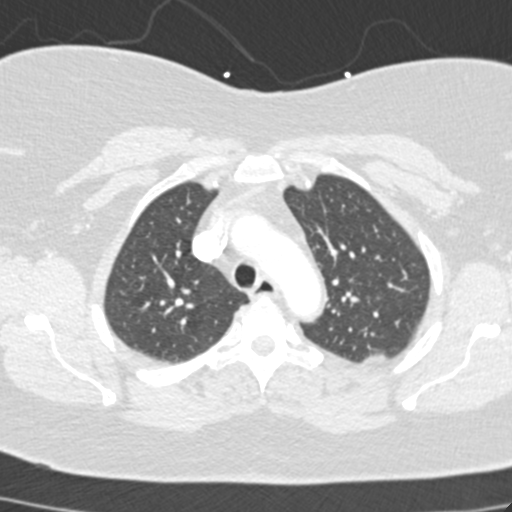
[im 168/204  soft-tissue]
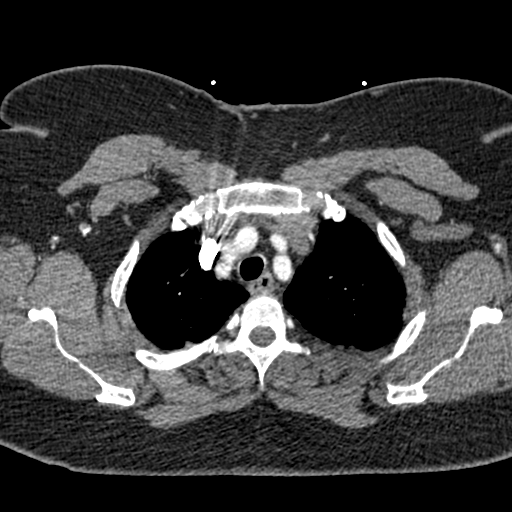
[im 177/204  lung]
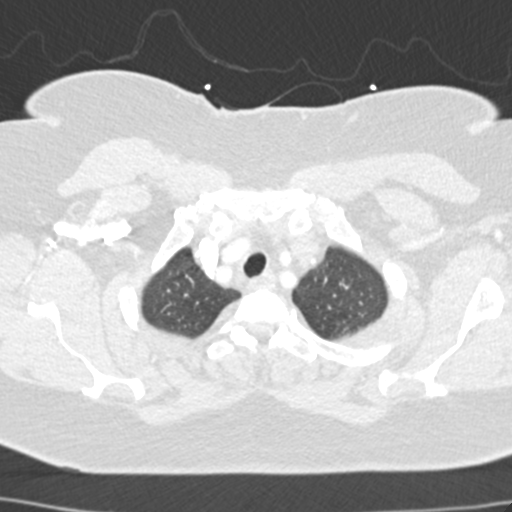
[im 195/204  soft-tissue]
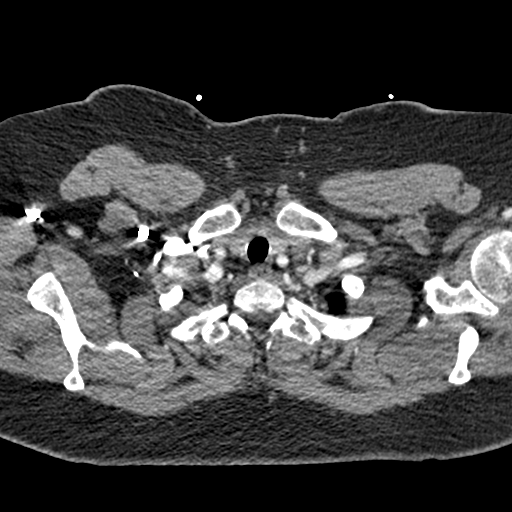

[Series 8: coronal mpr · coronal · 0.42mm/px · 3 of 131 slices shown]
[im 33/131  soft-tissue]
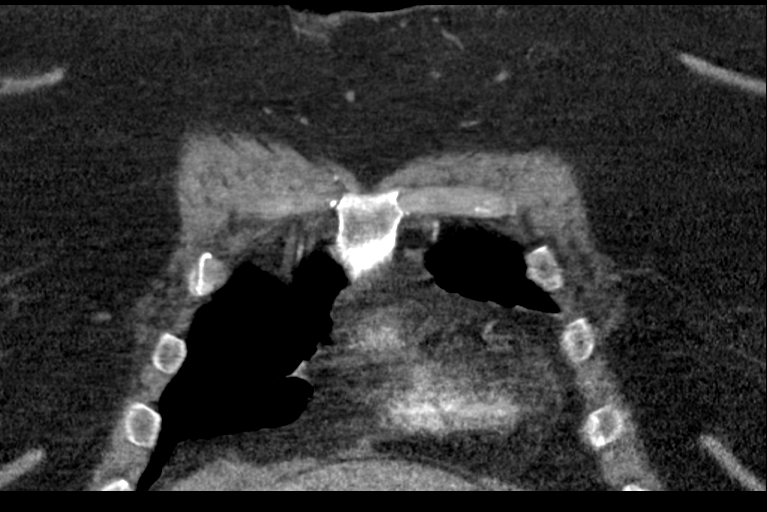
[im 66/131  soft-tissue]
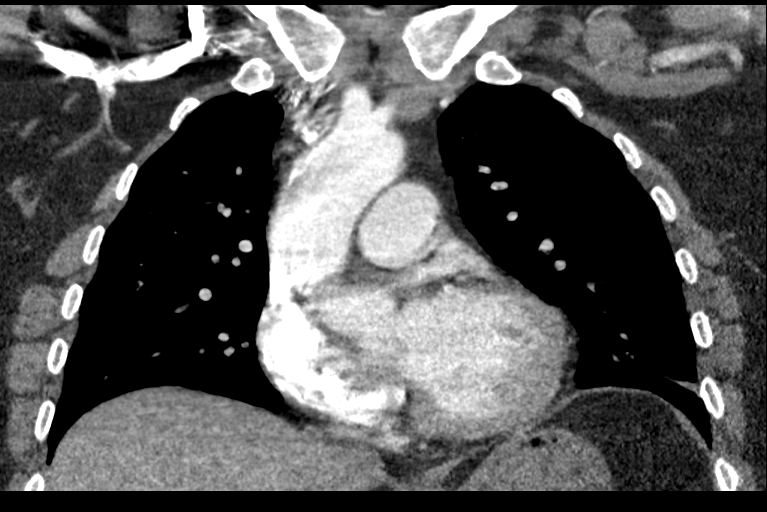
[im 98/131  soft-tissue]
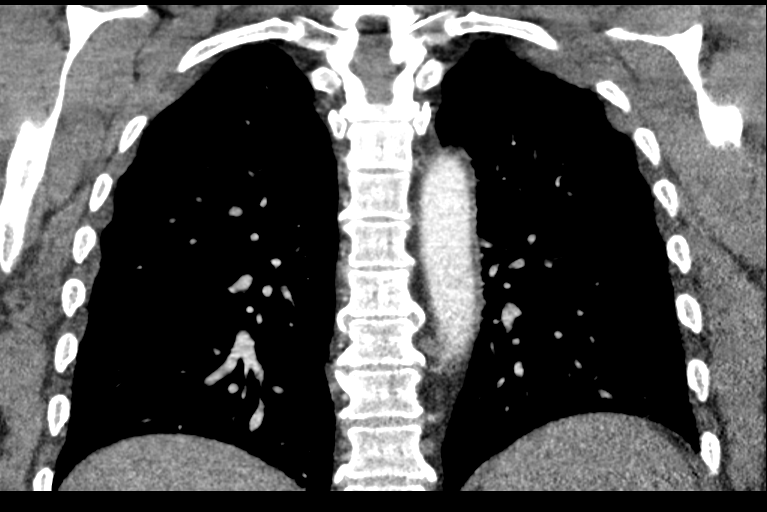

[17 of 46 positions shown; findings below may reference images not displayed]

FINDINGS: Cardiovascular:  Cardiac enlargement.  No pericardial effusion.

The main pulmonary artery is patent. No saddle embolus. No central
obstructing pulmonary emboli identified. No lobar or segmental
pulmonary artery filling defects identified.

Mediastinum/Nodes: No enlarged mediastinal, hilar, or axillary lymph
nodes. Thyroid gland, trachea, and esophagus demonstrate no
significant findings.

Lungs/Pleura: No pleural effusion. No airspace consolidation.
Subsegmental atelectasis within the lingula noted. 2 mm left lower
lobe lung nodule is identified, image 61/6. New from previous exam.
Calcified granuloma also noted in the left lower lobe.

Upper Abdomen: No acute findings within the visualized portions of
the upper abdomen.

Musculoskeletal: No chest wall abnormality. No acute or significant
osseous findings.

Review of the MIP images confirms the above findings.
IMPRESSION: 1. No acute cardiopulmonary abnormalities. No evidence for acute
pulmonary embolus.
2. 2 mm left lower lobe lung nodule, new from previous exam. No
follow-up needed if patient is low-risk. Non-contrast chest CT can
be considered in 12 months if patient is high-risk. This
recommendation follows the consensus statement: Guidelines for
Management of Incidental Pulmonary Nodules Detected on CT Images:

## 2022-09-02 ENCOUNTER — Other Ambulatory Visit: Payer: Self-pay | Admitting: Obstetrics & Gynecology

## 2022-09-02 DIAGNOSIS — M79622 Pain in left upper arm: Secondary | ICD-10-CM

## 2022-09-02 DIAGNOSIS — R2232 Localized swelling, mass and lump, left upper limb: Secondary | ICD-10-CM

## 2022-09-15 ENCOUNTER — Other Ambulatory Visit: Payer: Self-pay | Admitting: Obstetrics & Gynecology

## 2022-09-15 ENCOUNTER — Ambulatory Visit
Admission: RE | Admit: 2022-09-15 | Discharge: 2022-09-15 | Disposition: A | Discharge status: Home / Self Care | Payer: BC Managed Care – PPO | Source: Ambulatory Visit | Attending: Obstetrics & Gynecology | Admitting: Obstetrics & Gynecology

## 2022-09-15 DIAGNOSIS — R2232 Localized swelling, mass and lump, left upper limb: Secondary | ICD-10-CM

## 2022-09-15 DIAGNOSIS — M79622 Pain in left upper arm: Secondary | ICD-10-CM

## 2022-09-15 DIAGNOSIS — N6310 Unspecified lump in the right breast, unspecified quadrant: Secondary | ICD-10-CM

## 2022-09-20 ENCOUNTER — Ambulatory Visit
Admission: RE | Admit: 2022-09-20 | Discharge: 2022-09-20 | Disposition: A | Discharge status: Home / Self Care | Payer: BC Managed Care – PPO | Source: Ambulatory Visit | Attending: Obstetrics & Gynecology | Admitting: Obstetrics & Gynecology

## 2022-09-20 DIAGNOSIS — N6310 Unspecified lump in the right breast, unspecified quadrant: Secondary | ICD-10-CM

## 2022-09-20 HISTORY — PX: BREAST BIOPSY: SHX20

## 2022-09-29 ENCOUNTER — Other Ambulatory Visit: Payer: BC Managed Care – PPO

## 2022-12-09 ENCOUNTER — Ambulatory Visit: Payer: BC Managed Care – PPO | Admitting: Cardiology

## 2022-12-27 ENCOUNTER — Ambulatory Visit: Payer: BC Managed Care – PPO | Attending: Cardiology | Admitting: Cardiology

## 2022-12-27 ENCOUNTER — Encounter: Payer: Self-pay | Admitting: *Deleted

## 2022-12-27 ENCOUNTER — Encounter: Payer: Self-pay | Admitting: Cardiology

## 2022-12-27 VITALS — BP 130/88 | HR 75 | Ht 66.0 in | Wt 314.0 lb

## 2022-12-27 DIAGNOSIS — R0683 Snoring: Secondary | ICD-10-CM | POA: Diagnosis not present

## 2022-12-27 DIAGNOSIS — R002 Palpitations: Secondary | ICD-10-CM | POA: Diagnosis not present

## 2022-12-27 DIAGNOSIS — R0602 Shortness of breath: Secondary | ICD-10-CM

## 2022-12-27 DIAGNOSIS — I10 Essential (primary) hypertension: Secondary | ICD-10-CM

## 2022-12-27 NOTE — Patient Instructions (Addendum)
Medication Instructions:  The current medical regimen is effective;  continue present plan and medications.  *If you need a refill on your cardiac medications before your next appointment, please call your pharmacy*  Testing/Procedures: Your physician has requested that you have an echocardiogram. Echocardiography is a painless test that uses sound waves to create images of your heart. It provides your doctor with information about the size and shape of your heart and how well your heart's chambers and valves are working. This procedure takes approximately one hour. There are no restrictions for this procedure. Please do NOT wear cologne, perfume, aftershave, or lotions (deodorant is allowed). Please arrive 15 minutes prior to your appointment time.  Your physician has recommended that you have a sleep study. This test records several body functions during sleep, including: brain activity, eye movement, oxygen and carbon dioxide blood levels, heart rate and rhythm, breathing rate and rhythm, the flow of air through your mouth and nose, snoring, body muscle movements, and chest and belly movement.  Follow-Up: At [facility], you and your health needs are our priority.  As part of our continuing mission to provide you with exceptional heart care, we have created designated Provider Care Teams.  These Care Teams include your primary Cardiologist (physician) and Advanced Practice Providers (APPs -  Physician Assistants and Nurse Practitioners) who all work together to provide you with the care you need, when you need it.  We recommend signing up for the patient portal called "MyChart".  Sign up information is provided on this After Visit Summary.  MyChart is used to connect with patients for Virtual Visits (Telemedicine).  Patients are able to view lab/test results, encounter notes, upcoming appointments, etc.  Non-urgent messages can be sent to your provider as well.   To learn more about what  you can do with MyChart, go to [email or web address].    Your next appointment:   Follow up based on the results of the above testing.

## 2022-12-27 NOTE — Progress Notes (Signed)
Cardiology Office Note:    Date:  12/27/2022   ID:  Marissa Marks, [place] 1966-04-10, MRN [id number]  PCP:  Jettie Booze, NP   [place] Providers Cardiologist:  None     Referring MD: Jettie Booze, NP    History of Present Illness:    Marissa Marks is a 57 y.o. 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  At the request of Adriana Reams, NP.  08/2022 CT: PULMONARY ARTERY: The main pulmonary artery is enlarged and measures at least 3.3 cm in transverse dimension. There are no findings to suggest a pulmonary embolism.   Think she has sleep apnea. Woke up and could not catch breath.   SOB with activity, no chest discomfort.    No fevers chills nausea vomiting syncope bleeding.  Past Medical History:  Diagnosis Date   Anemia    history of   Arthritis    Chronic cholecystitis with calculus 10/29/2012   Hand numbness    occ   Heart palpitations    Hypercholesterolemia    Hypertension    no longer taking medication for one year   PONV (postoperative nausea and vomiting)     Past Surgical History:  Procedure Laterality Date   ABDOMINAL HYSTERECTOMY Bilateral 05/03/2016   Procedure: HYSTERECTOMY ABDOMINAL;  Surgeon: Maisie Fus, MD;  Location: [facility];  Service: Gynecology;  Laterality: Bilateral;  bilateral salpingoopherectomy    BILATERAL SALPINGECTOMY  05/03/2016   Procedure: BILATERAL SALPINGECTOMY;  Surgeon: Maisie Fus, MD;  Location: [facility];  Service: Gynecology;;   BREAST BIOPSY Right 09/20/2022   [place] RT BREAST BX W LOC DEV 1ST LESION IMG BX [place] US GUIDE 09/20/2022 [facility]   CESAREAN SECTION  11/10/86 09/06/88   x2   CHOLECYSTECTOMY  11/06/2012   Procedure: LAPAROSCOPIC CHOLECYSTECTOMY WITH INTRAOPERATIVE CHOLANGIOGRAM;  Surgeon: Imogene Burn. Georgette Dover, MD;  Location: [facility];  Service: General;  Laterality: N/A;   [place]     childhood    OOPHORECTOMY  05/03/2016   Procedure: BILATERAL OOPHORECTOMY;  Surgeon: Maisie Fus, MD;  Location: [facility];  Service: Gynecology;;    Current Medications: Current Meds  Medication Sig   losartan (COZAAR) 50 MG tablet Take 25 mg by mouth daily.     Allergies:   Patient has no known allergies.   Social History   Socioeconomic History   Marital status: Married    Spouse name: Not on file   Number of children: 2   Years of education: Not on file   Highest education level: Not on file  Occupational History   Occupation: Teacher    Employer: [employer]   Tobacco Use   Smoking status: Never   Smokeless tobacco: Never  Vaping Use   Vaping Use: Never used  Substance and Sexual Activity   Alcohol use: No   Drug use: No   Sexual activity: Not Currently  Other Topics Concern   Not on file  Social History Narrative   Not on file   Social Determinants of Health   Financial Resource Strain: Not on file  Food Insecurity: Not on file  Transportation Needs: Not on file  Physical Activity: Not on file  Stress: Not on file  Social Connections: Not on file     Family History: The patient's family history includes Heart disease in her father, maternal grandfather, maternal grandmother,  paternal grandfather, and paternal grandmother; Ovarian cancer in her paternal grandmother; Parkinson's disease in her mother.  ROS:   Please see the history of present illness.     All other systems reviewed and are negative.  EKGs/Labs/Other Studies Reviewed:    The following studies were reviewed today: Prior office notes reviewed CT scan reviewed  EKG: 12/27/2022-normal sinus rhythm left anterior fascicular block heart rate 75 bpm  Recent Labs: No results found for requested labs within last 365 days.  Recent Lipid Panel No results found for: "CHOL", "TRIG", "HDL", "CHOLHDL", "VLDL", "LDLCALC", "LDLDIRECT"   Risk Assessment/Calculations:               Physical Exam:     VS:  BP 130/88   Pulse 75   Ht '5\' 6"'$  (1.676 m)   Wt (!) 314 lb (142.4 kg)   LMP 04/14/2016 (Exact Date) Comment: still bleeding  SpO2 94%   BMI 50.68 kg/m     Wt Readings from Last 3 Encounters:  12/27/22 (!) 314 lb (142.4 kg)  02/19/19 290 lb (131.5 kg)  05/03/16 (!) 301 lb (136.5 kg)     GEN:  Well nourished, well developed in no acute distress HEENT: Normal NECK: No JVD; No carotid bruits LYMPHATICS: No lymphadenopathy CARDIAC: RRR, no murmurs, rubs, gallops RESPIRATORY:  Clear to auscultation without rales, wheezing or rhonchi  ABDOMEN: Soft, non-tender, non-distended MUSCULOSKELETAL:  No edema; No deformity  SKIN: Warm and dry NEUROLOGIC:  Alert and oriented x 3 PSYCHIATRIC:  Normal affect   ASSESSMENT:    1. Shortness of breath   2. Snoring   3. Primary hypertension   4. Heart palpitations    PLAN:    In order of problems listed above:  Dilated pulmonary artery - Suspect that this is secondary to increased weight.  BMI 50.  She also thinks that she may have sleep apnea, we will check a sleep study.  I will also check an echocardiogram to estimate pulmonary pressures.  Thankfully CT scan did not show any evidence of pulmonary embolism.  Morbid obesity - We discussed weight loss treatments with GLP agonist.  She has been prescribed at 1 point [facility] but ended up not taking.  Concerned about potential [place]-term risks.  We discussed at length today.          Medication Adjustments/Labs and Tests Ordered: Current medicines are reviewed at length with the patient today.  Concerns regarding medicines are outlined above.  Orders Placed This Encounter  Procedures   EKG 12-Lead   ECHOCARDIOGRAM COMPLETE   Itamar Sleep Study   No orders of the defined types were placed in this encounter.   Patient Instructions  Medication Instructions:  The current medical regimen is effective;  continue present plan and medications.  *If you need a refill on your  cardiac medications before your next appointment, please call your pharmacy*  Testing/Procedures: Your physician has requested that you have an echocardiogram. Echocardiography is a painless test that uses sound waves to create images of your heart. It provides your doctor with information about the size and shape of your heart and how well your heart's chambers and valves are working. This procedure takes approximately one hour. There are no restrictions for this procedure. Please do NOT wear cologne, perfume, aftershave, or lotions (deodorant is allowed). Please arrive 15 minutes prior to your appointment time.  Your physician has recommended that you have a sleep study. This test records several body functions during sleep, including: brain activity,  eye movement, oxygen and carbon dioxide blood levels, heart rate and rhythm, breathing rate and rhythm, the flow of air through your mouth and nose, snoring, body muscle movements, and chest and belly movement.  Follow-Up: At [facility], you and your health needs are our priority.  As part of our continuing mission to provide you with exceptional heart care, we have created designated Provider Care Teams.  These Care Teams include your primary Cardiologist (physician) and Advanced Practice Providers (APPs -  Physician Assistants and Nurse Practitioners) who all work together to provide you with the care you need, when you need it.  We recommend signing up for the patient portal called "MyChart".  Sign up information is provided on this After Visit Summary.  MyChart is used to connect with patients for Virtual Visits (Telemedicine).  Patients are able to view lab/test results, encounter notes, upcoming appointments, etc.  Non-urgent messages can be sent to your provider as well.   To learn more about what you can do with MyChart, go to [email or web address].    Your next appointment:   Follow up based on the results of the above  testing.     Signed, Candee Furbish, MD  12/27/2022 4:52 PM    [place]

## 2022-12-28 ENCOUNTER — Telehealth: Payer: Self-pay | Admitting: *Deleted

## 2022-12-28 NOTE — Telephone Encounter (Signed)
I s/w the pt after receiving a staff message about needing Itamar sleep study. Pt would like to set up study when she is here 01/20/23 for her echo. I said that be fine. I will note the echo appt notes the pt will need to see me after the echo is done. Pt thanked me for the help.

## 2022-12-28 NOTE — Telephone Encounter (Signed)
-----   Message from Shellia Cleverly, RN sent at 12/27/2022  4:12 PM EST ----- Regarding: Itamar Pt has been ordered to have an Itamar - she is coming back 3/1 for an echo and would like to be scheduled then if possible.    Thank you!  Pam

## 2022-12-29 NOTE — Telephone Encounter (Signed)
Prior Authorization for ITAMAR sent to [employer] via web portal. Tracking Number . READY- do not require Pre-Authorization 

## 2023-01-20 ENCOUNTER — Ambulatory Visit (HOSPITAL_COMMUNITY): Payer: BC Managed Care – PPO | Attending: Cardiology

## 2023-01-20 DIAGNOSIS — R0602 Shortness of breath: Secondary | ICD-10-CM | POA: Diagnosis present

## 2023-01-20 LAB — ECHOCARDIOGRAM COMPLETE
Area-P 1/2: 4.19 cm2
S' Lateral: 2.7 cm

## 2023-01-20 NOTE — Telephone Encounter (Signed)
Sleep Apnea Evaluation  [facility]  Today's Date: 01/20/2023   Patient Name: Marissa Marks        DOB: [birth year]       Height:        Weight:    BMI: There is no height or weight on file to calculate BMI.    Referring Provider:     STOP-BANG RISK ASSESSMENT         If STOP-BANG Score ?3 OR two clinical symptoms - patient qualifies for WatchPAT (CPT 95800)      Sleep study ordered due to two (2) of the following clinical symptoms/diagnoses:  Excessive daytime sleepiness G47.10  Gastroesophageal reflux K21.9  Nocturia R35.1  Morning Headaches G44.221  Difficulty concentrating R41.840  Memory problems or poor judgment G31.84  Personality changes or irritability R45.4  Loud snoring R06.83  Depression F32.9  Unrefreshed by sleep G47.8  Impotence N52.9  History of high blood pressure R03.0  Insomnia G47.00  Sleep Disordered Breathing or Sleep Apnea ICD G47.33

## 2023-01-20 NOTE — Telephone Encounter (Signed)
Pt was in the office today for echo. After echo I helped pt set up Itamar study and reviewed instructions. Pt has been approved already and has been given PIN# [id number]. Pt states she will do the study tonight or tomorrow night.   Called and made the patient aware that she may proceed with the [facility] Sleep Study. PIN # provided to the patient. Patient made aware that she will be contacted after the test has been read with the results and any recommendations. Patient verbalized understanding and thanked me for the call.

## 2023-01-21 ENCOUNTER — Encounter (INDEPENDENT_AMBULATORY_CARE_PROVIDER_SITE_OTHER): Payer: BC Managed Care – PPO | Admitting: Cardiology

## 2023-01-21 DIAGNOSIS — G4733 Obstructive sleep apnea (adult) (pediatric): Secondary | ICD-10-CM

## 2023-02-06 ENCOUNTER — Ambulatory Visit: Payer: BC Managed Care – PPO | Attending: Cardiology

## 2023-02-06 DIAGNOSIS — R0683 Snoring: Secondary | ICD-10-CM

## 2023-02-06 NOTE — Procedures (Signed)
SLEEP STUDY REPORT Patient Information Study Date: 01/21/2023 Patient Name: Marissa Marks Patient ID: [id number] Birth Date: 1966-08-14 Age: 57 Gender: Female BMI: 50.7 (W=315 lb, H=5' 6'') Stopbang: 7 Referring Physician: Candee Furbish, MD  TEST DESCRIPTION: Home sleep apnea testing was completed using the WatchPat, a Type 1 device, utilizing peripheral arterial tonometry (PAT), chest movement, actigraphy, pulse oximetry, pulse rate, body position and snore.  AHI was calculated with apnea and hypopnea using valid sleep time as the denominator. RDI includes apneas, hypopneas, and RERAs.  The data acquired and the scoring of sleep and all associated events were performed in accordance with the recommended standards and specifications as outlined in the AASM Manual for the Scoring of Sleep and Associated Events 2.2.0 (2015).  FINDINGS:  1.  Severe Obstructive Sleep Apnea with AHI 84.6/hr.   2.  No Central Sleep Apnea with pAHIc 3/hr.  3.  Oxygen desaturations as low as 66%.  4.  Severe snoring was present. O2 sats were < 88% for 111.9 min.  5.  Total sleep time was 6 hrs and 38 min.  6.  24.5% of total sleep time was spent in REM sleep.   7.  Normal sleep onset latency at 19 min.   8.  Shortened REM sleep onset latency at 82 min.   9.  Total awakenings were 7.  10. Arrhythmia detection:  None.  DIAGNOSIS:   Severe Obstructive Sleep Apnea (G47.33) Nocturnal Hypoxemia  RECOMMENDATIONS:   1.  Clinical correlation of these findings is necessary.  The decision to treat obstructive sleep apnea (OSA) is usually based on the presence of apnea symptoms or the presence of associated medical conditions such as Hypertension, Congestive Heart Failure, Atrial Fibrillation or Obesity.  The most common symptoms of OSA are snoring, gasping for breath while sleeping, daytime sleepiness and fatigue.   2.  Initiating apnea therapy is recommended given the presence of symptoms and/or associated conditions.  Recommend proceeding with one of the following:     a.  Auto-CPAP therapy with a pressure range of 5-20cm H2O.     b.  An oral appliance (OA) that can be obtained from certain dentists with expertise in sleep medicine.  These are primarily of use in non-obese patients with mild and moderate disease.     c.  An ENT consultation which may be useful to look for specific causes of obstruction and possible treatment options.     d.  If patient is intolerant to PAP therapy, consider referral to ENT for evaluation for hypoglossal nerve stimulator.   3.  Close follow-up is necessary to ensure success with CPAP or oral appliance therapy for maximum benefit.  4.  A follow-up oximetry study on CPAP is recommended to assess the adequacy of therapy and determine the need for supplemental oxygen or the potential need for Bi-level therapy.  An arterial blood gas to determine the adequacy of baseline ventilation and oxygenation should also be considered.  5.  Healthy sleep recommendations include:  adequate nightly sleep (normal 7-9 hrs/night), avoidance of caffeine after noon and alcohol near bedtime, and maintaining a sleep environment that is cool, dark and quiet.  6.  Weight loss for overweight patients is recommended.  Even modest amounts of weight loss can significantly improve the severity of sleep apnea.  7.  Snoring recommendations include:  weight loss where appropriate, side sleeping, and avoidance of alcohol before bed.  8.  Operation of motor vehicle should be avoided when sleepy.  Signature: Fransico Him,  MD; Leonidas Romberg; Diplomat, American Board of Sleep Medicine Electronically Signed: 02/06/2023

## 2023-02-15 ENCOUNTER — Telehealth: Payer: Self-pay | Admitting: *Deleted

## 2023-02-15 DIAGNOSIS — I10 Essential (primary) hypertension: Secondary | ICD-10-CM

## 2023-02-15 DIAGNOSIS — G4733 Obstructive sleep apnea (adult) (pediatric): Secondary | ICD-10-CM

## 2023-02-15 NOTE — Telephone Encounter (Addendum)
The patient has been notified of the result and verbalized understanding.  All questions (if any) were answered. 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 She has been driving ok for now but when she has to run the heat on the bus it can make her sleepy. Some mornings she is sleepy from not sleeping well the night before. If you feel it is not safe she will need a doctors note stating that she is not allowed to drive period.  Please advise

## 2023-02-15 NOTE — Telephone Encounter (Signed)
-----   Message from Lauralee Evener, [place] sent at 02/06/2023  8:30 AM EDT -----  ----- Message ----- From: Sueanne Margarita, MD Sent: 02/06/2023   8:17 AM EDT To: Cv Div Sleep Studies  Please let patient know that they have sleep apnea.  Recommend therapeutic CPAP titration ASAP for treatment of patient's sleep disordered breathing.  If unable to perform an in lab titration then initiate ResMed auto CPAP from 4 to 15cm H2O with heated humidity and mask of choice and overnight pulse ox on CPAP.

## 2023-02-16 ENCOUNTER — Telehealth: Payer: Self-pay | Admitting: Cardiology

## 2023-02-16 ENCOUNTER — Telehealth: Payer: Self-pay | Admitting: *Deleted

## 2023-02-16 NOTE — Telephone Encounter (Signed)
  Pt is calling, she is requesting a note from Dr. 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 Landis Gandy recommendations from her sleep study result

## 2023-02-16 NOTE — Telephone Encounter (Signed)
Marissa Margarita, MD  Freada Bergeron, CMA SHe needs to contact her [employer]

## 2023-02-16 NOTE — Telephone Encounter (Signed)
Informed patient of Dr Landis Gandy recommendation and patient understanding was verbalized. Patient understands she needs to reach out to Dr Marlou Porch her cardiologist or her PCP for a medical note.

## 2023-02-16 NOTE — Telephone Encounter (Signed)
-----   Message from Sueanne Margarita, MD sent at 02/15/2023  6:16 PM EDT ----- Regarding: RE: Question Since she has severe OSA she likely should not be driving.  Since I have not seen her yet as a patient this needs to come from Dr. 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 She has been driving ok for now but when she has to run the heat on the bus it can make her sleepy. Some mornings she is sleepy from not sleeping well the night before. If you feel it is not safe she will need a doctors note stating that she is not allowed to drive period.  Please advise

## 2023-02-16 NOTE — Telephone Encounter (Signed)
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

## 2023-02-21 ENCOUNTER — Encounter: Payer: Self-pay | Admitting: *Deleted

## 2023-02-21 NOTE — Telephone Encounter (Signed)
Jerline Pain, MD  You2 hours ago (5:36 AM)   Agree.  Per Dr. 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 Candee Furbish, MD   Spoke with pt who is aware letter will be provided, printed and left at the front desk for pick up. (As per her request).

## 2023-02-23 ENCOUNTER — Encounter: Payer: Self-pay | Admitting: *Deleted

## 2023-02-23 NOTE — Telephone Encounter (Signed)
Patient came into office with letter re: no driving  she requested it not indicate the reason she should not be driving.  Severe osa removed from letter.  Letter reprinted and provided.  Pt is appreciative for assistance.

## 2023-03-12 ENCOUNTER — Ambulatory Visit (HOSPITAL_BASED_OUTPATIENT_CLINIC_OR_DEPARTMENT_OTHER): Payer: BC Managed Care – PPO | Attending: Cardiology | Admitting: Cardiology

## 2023-03-12 VITALS — Ht 66.0 in | Wt 315.0 lb

## 2023-03-12 DIAGNOSIS — G4733 Obstructive sleep apnea (adult) (pediatric): Secondary | ICD-10-CM

## 2023-03-13 NOTE — Procedures (Signed)
   Patient Name: Marissa Marks, Marissa Marks Study Date: 03/12/2023 Gender: Female D.O.B: Dec 28, 1965 Age (years): 75 Referring Provider: Armanda Magic MD, ABSM Height (inches): 65 Interpreting Physician: Armanda Magic MD, ABSM Weight (lbs): 315 RPSGT: Lowry Ram BMI: 52 MRN: [id number] Neck Size: 16.75  CLINICAL INFORMATION The patient is referred for a CPAP titration to treat sleep apnea.  SLEEP STUDY TECHNIQUE As per the AASM Manual for the Scoring of Sleep and Associated Events v2.3 ([place] 2016) with a hypopnea requiring 4% desaturations.  The channels recorded and monitored were frontal, central and occipital EEG, electrooculogram (EOG), submentalis EMG (chin), nasal and oral airflow, thoracic and abdominal wall motion, anterior tibialis EMG, snore microphone, electrocardiogram, and pulse oximetry. Continuous positive airway pressure (CPAP) was initiated at the beginning of the study and titrated to treat sleep-disordered breathing.  MEDICATIONS Medications self-administered by patient taken the night of the study : Rosuvastatin-Calcium  TECHNICIAN COMMENTS Comments added by technician: Patient was restless all through the night. Comments added by scorer: N/A  RESPIRATORY PARAMETERS Optimal PAP Pressure (cm): 15  AHI at Optimal Pressure (/hr):0.8 Overall Minimal O2 (%):83.0  Supine % at Optimal Pressure (%):1 Minimal O2 at Optimal Pressure (%): 89.0   SLEEP ARCHITECTURE The study was initiated at 10:22:51 PM and ended at 5:09:25 AM.  Sleep onset time was 15.4 minutes and the sleep efficiency was 84.6%. The total sleep time was 344.1 minutes.  The patient spent 10.5% of the night in stage N1 sleep, 68.5% in stage N2 sleep, 0.0% in stage N3 and 21.1% in REM.Stage REM latency was 113.0 minutes  Wake after sleep onset was 47.0. Alpha intrusion was absent. Supine sleep was 23.40%.  CARDIAC DATA The 2 lead EKG demonstrated sinus rhythm. The mean heart rate was 63.7 beats per minute.  Other EKG findings include: None. LEG MOVEMENT DATA The total Periodic Limb Movements of Sleep (PLMS) were 0. The PLMS index was 0.0. A PLMS index of <15 is considered normal in adults.  IMPRESSIONS - The optimal PAP pressure was 15 cm of water. - Central sleep apnea was not noted during this titration (CAI = 0.7/h). - Moderate oxygen desaturations were observed during this titration (min O2 = 83.0%). - The patient snored with moderate snoring volume during this titration study. - No cardiac abnormalities were observed during this study. - Clinically significant periodic limb movements were not noted during this study. Arousals associated with PLMs were rare.  DIAGNOSIS - Obstructive Sleep Apnea (G47.33)  RECOMMENDATIONS - Trial of ResMed CPAP therapy on 15 cm H2O with a Medium size Resmed Full Face AirFit F10 mask and heated humidification. - Avoid alcohol, sedatives and other CNS depressants that may worsen sleep apnea and disrupt normal sleep architecture. - Sleep hygiene should be reviewed to assess factors that may improve sleep quality. - Weight management and regular exercise should be initiated or continued. - Return to Sleep Center for re-evaluation after 4 weeks of therapy - ONO on CPAP  [Electronically signed] 03/13/2023 10:08 AM  Armanda Magic MD, ABSM Diplomate, American Board of Sleep Medicine

## 2023-03-20 ENCOUNTER — Encounter: Payer: Self-pay | Admitting: *Deleted

## 2023-03-20 ENCOUNTER — Telehealth: Payer: Self-pay | Admitting: *Deleted

## 2023-03-20 DIAGNOSIS — I10 Essential (primary) hypertension: Secondary | ICD-10-CM

## 2023-03-20 DIAGNOSIS — G4733 Obstructive sleep apnea (adult) (pediatric): Secondary | ICD-10-CM

## 2023-03-20 NOTE — Telephone Encounter (Signed)
-----   Message from Gaynelle Cage, [place] sent at 03/13/2023  1:39 PM EDT -----  ----- Message ----- From: Quintella Reichert, MD Sent: 03/13/2023  10:10 AM EDT To: Cv Div Sleep Studies  Please let patient know that they had a successful PAP titration and let DME know that orders are in EPIC.  Please set up 6 week OV with me. Needs ONO on CPAP

## 2023-03-20 NOTE — Addendum Note (Signed)
Addended by: Reesa Chew on: 03/20/2023 05:47 PM   Modules accepted: Orders

## 2023-03-20 NOTE — Telephone Encounter (Signed)
The patient has been notified of the result and verbalized understanding.  All questions (if any) were answered. Marissa Marks, CMA 03/20/2023 4:58 PM     Upon patient request DME selection is [facility]. Patient understands he will be contacted by [facility] to set up his cpap. Patient understands to call if [facility]  does not contact him with new setup in a timely manner. Patient understands they will be called once confirmation has been received from [facility] that they have received their new machine to schedule 10 week follow up appointment.   [facility] notified of new cpap order  Please add to airview Patient was grateful for the call and thanked me.

## 2023-04-10 ENCOUNTER — Telehealth: Payer: Self-pay | Admitting: Cardiology

## 2023-04-10 DIAGNOSIS — G4733 Obstructive sleep apnea (adult) (pediatric): Secondary | ICD-10-CM

## 2023-04-10 DIAGNOSIS — I10 Essential (primary) hypertension: Secondary | ICD-10-CM

## 2023-04-10 DIAGNOSIS — R0683 Snoring: Secondary | ICD-10-CM

## 2023-04-10 NOTE — Telephone Encounter (Signed)
Patient stated that she started her CPAP machine on Wednesday. Patient stated she woke up and felt fine. Ever since then she wakes up, when she takes a deep breath her whole chest feels sore. Patient is unsure if there is an issue with the pressure coming from the machine. Please advise.

## 2023-04-10 NOTE — Telephone Encounter (Addendum)
Patient stated she started using CPAP machine on 5/15 and for the past several of mornings  her chest is sore. Pt stated she thinks "it took much pressure in her lungs".  On the 1st patient stated she felt fine,every since then patient stated when she wakes up her chest feels sore. Pt did not take anything for pain. She will like to know if her settings on the machine can be adjusted. Will forward to MD and nurse for advise.

## 2023-04-13 NOTE — Telephone Encounter (Signed)
Per Dr Mayford Knife, Change to auto CPAP from 5 to 13cm H2O and get a download in 2 weeks    Order sent to [employer] [facility]

## 2023-04-24 ENCOUNTER — Encounter: Payer: Self-pay | Admitting: Cardiology

## 2023-07-06 ENCOUNTER — Telehealth: Payer: Self-pay | Admitting: Cardiology

## 2023-07-06 NOTE — Telephone Encounter (Signed)
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 Please advise. (Please see phone note from 02/16/23)

## 2023-07-07 ENCOUNTER — Encounter: Payer: Self-pay | Admitting: *Deleted

## 2023-07-07 NOTE — Telephone Encounter (Signed)
Original letter had to have an end date which was written for the end of school year.  Pt needed new letter for this school year.  Sent to pt via MyChart per her request.

## 2023-08-01 ENCOUNTER — Encounter: Payer: Self-pay | Admitting: Cardiology

## 2023-08-01 ENCOUNTER — Ambulatory Visit: Payer: BC Managed Care – PPO | Attending: Cardiology | Admitting: Cardiology

## 2023-08-01 VITALS — Ht 66.0 in | Wt 325.0 lb

## 2023-08-01 DIAGNOSIS — I10 Essential (primary) hypertension: Secondary | ICD-10-CM | POA: Diagnosis not present

## 2023-08-01 DIAGNOSIS — G4733 Obstructive sleep apnea (adult) (pediatric): Secondary | ICD-10-CM | POA: Diagnosis not present

## 2023-08-01 NOTE — Progress Notes (Signed)
SLEEP MEDICINE VIRTUAL CONSULT NOTE via Video Note   Because of Marissa Marks's co-morbid illnesses, she is at least at moderate risk for complications without adequate follow up.  This format is felt to be most appropriate for this patient at this time.  All issues noted in this document were discussed and addressed.  A limited physical exam was performed with this format.  Please refer to the patient's chart for her consent to telehealth for [facility].  Date:  08/01/2023   ID:  Marissa Marks, DOB 09-04-1966, MRN [id number] The patient was identified using 2 identifiers.  Patient Location: Home Provider Location: Home Office   PCP:  Liesel Manson, NP   [facility] Providers Cardiologist:  Donato Schultz, MD  Evaluation Performed:  New Patient Evaluation  Chief Complaint:  OSA  History of Present Illness:    Marissa Marks is a 57 y.o. female who is being seen today for the evaluation of OSA at the request of Donato Schultz, MD.  Marissa Marks is a 57 y.o. female with this is a 57 year old morbidly obese female with a history of heart palpitations, hypertension and hyperlipidemia.  She was seen by Dr. 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  Due to her morbid obesity it was recommended that she have a sleep study done.  She underwent home sleep study showing severe obstructive sleep apnea with an AHI of 84.6/h with no significant central events.  She had nocturnal hypoxemia with O2 sats as low as 66% and less than 88% for 112 minutes during the study.  She underwent CPAP titration and was titrated to CPAP at 15 cm H2O.  She is now referred for sleep medicine consultation to establish sleep care and treatment of her obstructive sleep apnea.  She tells me that prior to her CPAP she have a lot of problems with daytime sleepiness and would have to take a nap when she got home.  Sometimes she would get sleepy when driving home from  work.  Her husband told her that she snored and would stop breathing in her sleep.    She is doing well with her PAP device and thinks that she has gotten used to it.  She has been using an under the nose FFM because she has problems with claustrophobia.  She says that some nights the mask works well and some nights the mask is leaking.  She feels the pressure is adequate. Sometimes she takes her mask off in her sleep and does not realize it.  Since going on PAP she feels more rested in the am and her daytime sleepiness has improved.  She denies any significant mouth or nasal dryness or nasal congestion.  She does not think that he snores.     Past Medical History:  Diagnosis Date   Anemia    history of   Arthritis    Chronic cholecystitis with calculus 10/29/2012   Hand numbness    occ   Heart palpitations    Hypercholesterolemia    Hypertension    no longer taking medication for one year   OSA on CPAP    severe obstructive sleep apnea with an AHI of 84.6/h  now on auto CPAP from 5-13cm H2O   PONV (postoperative nausea and vomiting)    Past Surgical History:  Procedure Laterality Date   ABDOMINAL HYSTERECTOMY Bilateral 05/03/2016   Procedure: HYSTERECTOMY ABDOMINAL;  Surgeon: Freddy Finner, MD;  Location: [facility];  Service: Gynecology;  Laterality: Bilateral;  bilateral salpingoopherectomy    BILATERAL SALPINGECTOMY  05/03/2016   Procedure: BILATERAL SALPINGECTOMY;  Surgeon: Freddy Finner, MD;  Location: [facility];  Service: Gynecology;;   BREAST BIOPSY Right 09/20/2022   [place] RT BREAST BX W LOC DEV 1ST LESION IMG BX SPEC US GUIDE 09/20/2022 [facility]   CESAREAN SECTION  11/10/86 09/06/88   x2   CHOLECYSTECTOMY  11/06/2012   Procedure: LAPAROSCOPIC CHOLECYSTECTOMY WITH INTRAOPERATIVE CHOLANGIOGRAM;  Surgeon: Wilmon Arms. Corliss Skains, MD;  Location: [facility];  Service: General;  Laterality: N/A;   DILATION AND CURETTAGE OF UTERUS     EYE SURGERY     childhood    OOPHORECTOMY  05/03/2016   Procedure: BILATERAL OOPHORECTOMY;  Surgeon: Freddy Finner, MD;  Location: [facility];  Service: Gynecology;;     Current Meds  Medication Sig   aspirin EC 81 MG tablet Take 81 mg by mouth daily. Reported on 03/16/2016   Cholecalciferol (VITAMIN D PO) Take 2,000 Units by mouth daily. Reported on 03/16/2016   fluticasone (FLONASE ALLERGY RELIEF) 50 MCG/ACT nasal spray Place 1 spray into both nostrils daily. Reported on 03/16/2016   losartan (COZAAR) 50 MG tablet Take 25 mg by mouth daily.   rosuvastatin (CRESTOR) 10 MG tablet Take 10 mg by mouth at bedtime.     Allergies:   Patient has no known allergies.   Social History   Tobacco Use   Smoking status: Never   Smokeless tobacco: Never  Vaping Use   Vaping status: Never Used  Substance Use Topics   Alcohol use: No   Drug use: No     Family Hx: The patient's family history includes Heart disease in her father, maternal grandfather, maternal grandmother, paternal grandfather, and paternal grandmother; Ovarian cancer in her paternal grandmother; Parkinson's disease in her mother.  ROS:   Please see the history of present illness.     All other systems reviewed and are negative.   Prior Sleep studies:   The following studies were reviewed today:  Home sleep study, PAP titration and PAP compliance download  Labs/Other Tests and Data Reviewed:     Recent Labs: No results found for requested labs within last 365 days.    Wt Readings from Last 3 Encounters:  08/01/23 (!) 325 lb (147.4 kg)  03/12/23 (!) 315 lb (142.9 kg)  12/27/22 (!) 314 lb (142.4 kg)     Risk Assessment/Calculations:      STOP-Bang Score:  7      Objective:    Vital Signs:  Ht 5\' 6"  (1.676 m)   Wt (!) 325 lb (147.4 kg)   LMP 04/14/2016 (Exact Date) Comment: still bleeding  BMI 52.46 kg/m    VITAL SIGNS:  reviewed GEN:  no acute distress EYES:  sclerae anicteric, EOMI - Extraocular Movements Intact RESPIRATORY:  normal  respiratory effort, symmetric expansion CARDIOVASCULAR:  no peripheral edema SKIN:  no rash, lesions or ulcers. MUSCULOSKELETAL:  no obvious deformities. NEURO:  alert and oriented x 3, no obvious focal deficit PSYCH:  normal affect  ASSESSMENT & PLAN:    OSA - The patient is tolerating PAP therapy well without any problems. The PAP download performed by his DME was personally reviewed and interpreted by me today and showed an AHI of 1.6/hr on auto CPAP from 5-13 cm H2O with 10% compliance in using more than 4 hours nightly.  The patient has been  using and benefiting from PAP use and will continue to benefit from therapy.  -I have encouraged her to try to be more compliant  Hypertension -BP is controlled at home -Continue prescription drug management with losartan 25 mg daily with as needed refills  Morbid obesity -Dr. Anne Fu recently discussed weight loss treatments including GLP-1 agonist.  She apparently had been prescribed Wegovy at 1 point but then did not take it. -I am going to refer her to healthy weight and wellness through [facility]  Time:   Today, I have spent 15 minutes with the patient with telehealth technology discussing the above problems.     Medication Adjustments/Labs and Tests Ordered: Current medicines are reviewed at length with the patient today.  Concerns regarding medicines are outlined above.   Tests Ordered: No orders of the defined types were placed in this encounter.   Medication Changes: No orders of the defined types were placed in this encounter.   Follow Up:  in 3 months  Signed, Armanda Magic, MD  08/01/2023 3:27 PM    [facility]

## 2023-08-01 NOTE — Patient Instructions (Signed)
Medication Instructions:  Your physician recommends that you continue on your current medications as directed. Please refer to the Current Medication list given to you today.  *If you need a refill on your cardiac medications before your next appointment, please call your pharmacy*  Lab Work: If you have labs (blood work) drawn today and your tests are completely normal, you will receive your results only by: MyChart Message (if you have MyChart) OR A paper copy in the mail If you have any lab test that is abnormal or we need to change your treatment, we will call you to review the results.  Testing/Procedures: None ordered today.  Follow-Up: At [facility], you and your health needs are our priority.  As part of our continuing mission to provide you with exceptional heart care, we have created designated Provider Care Teams.  These Care Teams include your primary Cardiologist (physician) and Advanced Practice Providers (APPs -  Physician Assistants and Nurse Practitioners) who all work together to provide you with the care you need, when you need it.  We recommend signing up for the patient portal called "MyChart".  Sign up information is provided on this After Visit Summary.  MyChart is used to connect with patients for Virtual Visits (Telemedicine).  Patients are able to view lab/test results, encounter notes, upcoming appointments, etc.  Non-urgent messages can be sent to your provider as well.   To learn more about what you can do with MyChart, go to [email or web address].    Your next appointment:   3 month(s)  Provider:   Dr. Mayford Knife

## 2023-08-24 ENCOUNTER — Encounter: Payer: Self-pay | Admitting: Family Medicine

## 2023-08-24 ENCOUNTER — Ambulatory Visit: Payer: BC Managed Care – PPO | Admitting: Family Medicine

## 2023-08-24 VITALS — BP 140/82 | HR 76 | Ht 66.0 in | Wt 333.4 lb

## 2023-08-24 DIAGNOSIS — R7301 Impaired fasting glucose: Secondary | ICD-10-CM

## 2023-08-24 DIAGNOSIS — E7800 Pure hypercholesterolemia, unspecified: Secondary | ICD-10-CM | POA: Diagnosis not present

## 2023-08-24 DIAGNOSIS — I10 Essential (primary) hypertension: Secondary | ICD-10-CM | POA: Diagnosis not present

## 2023-08-24 DIAGNOSIS — Z7689 Persons encountering health services in other specified circumstances: Secondary | ICD-10-CM

## 2023-08-24 DIAGNOSIS — Z6841 Body Mass Index (BMI) 40.0 and over, adult: Secondary | ICD-10-CM

## 2023-08-24 DIAGNOSIS — Z0000 Encounter for general adult medical examination without abnormal findings: Secondary | ICD-10-CM | POA: Diagnosis not present

## 2023-08-24 DIAGNOSIS — E66813 Obesity, class 3: Secondary | ICD-10-CM

## 2023-08-24 NOTE — Progress Notes (Signed)
Office Note 08/24/2023  CC:  Chief Complaint  Patient presents with   Establish Care    Pt wants to discuss weight management. Possible weight loss medication.    Pt also needs note stating she was seen in office today.     HPI:  Marissa Marks is a 57 y.o.  female who is here to establish care, CPE, wt mgmt. Patient's most recent primary MD: [facility] in [place]. Old records in epic/health Link EMR were reviewed prior to or during today's visit. Most recent CPE in EMR is dated 09/16/2022. Reviewed lab panel results from that visit: CBC normal (hemoglobin 13.4), complete metabolic panel normal (serum creatinine 0.82).  TSH 1.4. LDL 194, triglycerides 116, HDL 49. Hemoglobin A1c was 5.7%.  No acute concerns other than would like help losing weight. She is currently very busy as a Conservation officer, nature at [employer].  She also has her mother living with her who has Parkinson's disease.  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 She feels like her diet is often pretty good.  Takes half a losartan 50 mg tab daily.  Can feel blood pressure up at the end of the day sometimes and checks it and it is significantly elevated so she takes the other half.   Past Medical History:  Diagnosis Date   Arthritis    Hand numbness    occ   Heart palpitations    History of colon polyps    Hypercholesterolemia    Hypertension    no longer taking medication for one year   OSA on CPAP    severe obstructive sleep apnea with an AHI of 84.6/h  now on auto CPAP from 5-13cm H2O   Urinary incontinence     Past Surgical History:  Procedure Laterality Date   ABDOMINAL HYSTERECTOMY Bilateral 05/03/2016   Procedure: HYSTERECTOMY ABDOMINAL;  Surgeon: Freddy Finner, MD;  Location: [facility];  Service: Gynecology;  Laterality: Bilateral;  bilateral salpingoopherectomy    BILATERAL SALPINGECTOMY  05/03/2016   Procedure: BILATERAL SALPINGECTOMY;  Surgeon: Freddy Finner,  MD;  [facility];  Service: Gynecology;;   BREAST BIOPSY Right 09/20/2022   [place] RT BREAST BX W LOC DEV 1ST LESION IMG BX SPEC US GUIDE 09/20/2022 [facility]MO[facility]AP[facility]   CESAREAN SECTION  11/10/86 09/06/88   x2   CHOLECYSTECTOMY  11/06/2012   Procedure: LAPAROSCOPIC CHOLECYSTECTOMY WITH INTRAOPERATIVE CHOLANGIOGRAM;  Surgeon: Wilmon Arms. Corliss Skains, MD;  Location: [facility];  Service: General;  Laterality: N/A;   COLONOSCOPY  2020   DILATION AND CURETTAGE OF UTERUS     DOBUTAMINE STRESS ECHO     2013 normal   EYE SURGERY     childhood   OOPHORECTOMY  05/03/2016   Procedure: BILATERAL OOPHORECTOMY;  Surgeon: Freddy Finner, MD;  Location: [facility];  Service: Gynecology;;   Sleep study     2024   TRANSTHORACIC ECHOCARDIOGRAM     01/20/23 NORMAL    Family History  Problem Relation Age of Onset   Parkinson's disease Mother    Arthritis Mother    Heart disease Father    Arthritis Father    Diabetes Father    Heart attack Father    High Cholesterol Father    High blood pressure Father    Cancer Brother    Early death Brother    Heart disease Maternal Grandmother    Arthritis Maternal Grandmother    Heart attack Maternal Grandmother  Heart disease Maternal Grandfather    Early death Maternal Grandfather    Heart attack Maternal Grandfather    Mental illness Maternal Grandfather    Heart disease Paternal Grandmother    Ovarian cancer Paternal Grandmother    Arthritis Paternal Grandmother    Hearing loss Paternal Grandmother    Heart attack Paternal Grandmother    High blood pressure Paternal Grandmother    Mental illness Paternal Grandmother    Heart disease Paternal Grandfather    Alcohol abuse Paternal Grandfather    Early death Paternal Grandfather    Heart attack Paternal Grandfather    High Cholesterol Paternal Grandfather     Social History   Socioeconomic History   Marital status: Married    Spouse name: Not on file   Number of children: 2   Years of  education: Not on file   Highest education level: Not on file  Occupational History   Occupation: Magazine features editor: [employer]   Tobacco Use   Smoking status: Never   Smokeless tobacco: Never  Vaping Use   Vaping status: Never Used  Substance and Sexual Activity   Alcohol use: No   Drug use: No   Sexual activity: Not Currently  Other Topics Concern   Not on file  Social History Narrative   Married,   Educ: HS, 1 yr college   Occup: Architectural technologist   No tob or alc   Social Determinants of Health   Financial Resource Strain: Low Risk  (12/19/2022)   Received from [facility], [facility]   Overall Financial Resource Strain (CARDIA)    Difficulty of Paying Living Expenses: Not hard at all  Food Insecurity: No Food Insecurity (12/19/2022)   Received from [facility], [facility]   Hunger Vital Sign    Worried About Running Out of Food in the Last Year: Never true    Ran Out of Food in the Last Year: Never true  Transportation Needs: No Transportation Needs (12/19/2022)   Received from [facility], [facility]   PRAPARE - Transportation    Lack of Transportation (Medical): No    Lack of Transportation (Non-Medical): No  Physical Activity: Inactive (06/07/2022)   [facility] from [facility], [facility]   Exercise Vital Sign    Days of Exercise per Week: 0 days    Minutes of Exercise per Session: 0 min  Stress: Stress Concern Present (06/07/2022)   Received from [facility], [facility] of Occupational Health - Occupational Stress Questionnaire    Feeling of Stress : To some extent  Social Connections: Unknown (07/02/2023)   Received from [facility]   Social Network    Social Network: Not on file  Intimate Partner Violence: Unknown (07/02/2023)   Received from [facility]   HITS    Physically Hurt: Not on file    Insult or Talk Down To: Not on file    Threaten Physical Harm: Not on file    Scream or Curse: Not on  file    Outpatient Encounter Medications as of 08/24/2023  Medication Sig   aspirin EC 81 MG tablet Take 81 mg by mouth daily. Reported on 03/16/2016   Cetirizine HCl (ZYRTEC ALLERGY PO) Take 10 mg by mouth as needed.   Cholecalciferol (VITAMIN D PO) Take 2,000 Units by mouth daily. Reported on 03/16/2016   fluticasone (FLONASE ALLERGY RELIEF) 50 MCG/ACT nasal spray Place 1 spray into both nostrils daily. Reported on 03/16/2016  losartan (COZAAR) 50 MG tablet Take 50 mg by mouth daily.   methocarbamol (ROBAXIN) 500 MG tablet Take 1,000 mg by mouth 4 (four) times daily.   methylPREDNISolone (MEDROL DOSEPAK) 4 MG TBPK tablet TAKE 6 TABLETS ON DAY 1 AS DIRECTED ON PACKAGE AND DECREASE BY 1 TAB EACH DAY FOR A TOTAL OF 6 DAYS   loratadine (CLARITIN) 10 MG tablet Take 10 mg by mouth daily. Reported on 03/16/2016 (Patient not taking: Reported on 12/27/2022)   rosuvastatin (CRESTOR) 10 MG tablet Take 10 mg by mouth at bedtime. (Patient not taking: Reported on 08/24/2023)   No facility-administered encounter medications on file as of 08/24/2023.    No Known Allergies  Review of Systems  Constitutional:  Negative for appetite change, chills, fatigue and fever.  HENT:  Negative for congestion, dental problem, ear pain and sore throat.   Eyes:  Negative for discharge, redness and visual disturbance.  Respiratory:  Negative for cough, chest tightness, shortness of breath and wheezing.   Cardiovascular:  Negative for chest pain, palpitations and leg swelling.  Gastrointestinal:  Negative for abdominal pain, blood in stool, diarrhea, nausea and vomiting.  Genitourinary:  Negative for difficulty urinating, dysuria, flank pain, frequency, hematuria and urgency.  Musculoskeletal:  Negative for arthralgias, back pain, joint swelling, myalgias and neck stiffness.  Skin:  Negative for pallor and rash.  Neurological:  Negative for dizziness, speech difficulty, weakness and headaches.  Hematological:  Negative for  adenopathy. Does not bruise/bleed easily.  Psychiatric/Behavioral:  Negative for confusion and sleep disturbance. The patient is not nervous/anxious.     PE; Blood pressure (!) 140/82, pulse 76, height 5\' 6"  (1.676 m), weight (!) 333 lb 6.4 oz (151.2 kg), last menstrual period 04/14/2016, SpO2 94%. Body mass index is 53.81 kg/m.  Physical Exam  Exam chaperoned by Emi Holes, CMA. Gen: Alert, well appearing.  Patient is oriented to person, place, time, and situation. AFFECT: pleasant, lucid thought and speech. ENT: Ears: EACs clear, normal epithelium.  TMs with good light reflex and landmarks bilaterally.  Eyes: no injection, icteris, swelling, or exudate.  EOMI, PERRLA. Nose: no drainage or turbinate edema/swelling.  No injection or focal lesion.  Mouth: lips without lesion/swelling.  Oral mucosa pink and moist.  Dentition intact and without obvious caries or gingival swelling.  Oropharynx without erythema, exudate, or swelling.  Neck: supple/nontender.  No LAD, mass, or TM.  Carotid pulses 2+ bilaterally, without bruits. CV: RRR, no m/r/g.   LUNGS: CTA bilat, nonlabored resps, good aeration in all lung fields. ABD: soft, NT, ND, BS normal.  No hepatospenomegaly or mass.  No bruits. EXT: no clubbing, cyanosis, or edema.  Musculoskeletal: no joint swelling, erythema, warmth, or tenderness.  ROM of all joints intact. Skin - no sores or suspicious lesions or rashes or color changes  Pertinent labs:  Last CBC Lab Results  Component Value Date   WBC 6.0 02/19/2019   HGB 11.7 (L) 02/19/2019   HCT 37.3 02/19/2019   MCV 91.4 02/19/2019   MCH 28.7 02/19/2019   RDW 13.2 02/19/2019   PLT 206 02/19/2019   Last metabolic panel Lab Results  Component Value Date   GLUCOSE 134 (H) 02/19/2019   NA 140 02/19/2019   K 3.7 02/19/2019   CL 109 02/19/2019   CO2 24 02/19/2019   BUN 16 02/19/2019   CREATININE 0.69 02/19/2019   GFRNONAA >60 02/19/2019   CALCIUM 8.7 (L) 02/19/2019   PROT  6.5 02/19/2019   ALBUMIN 3.2 (L) 02/19/2019  BILITOT 0.3 02/19/2019   ALKPHOS 85 02/19/2019   AST 17 02/19/2019   ALT 16 02/19/2019   ANIONGAP 7 02/19/2019   ASSESSMENT AND PLAN:   1) Health maintenance exam: Reviewed age and gender appropriate health maintenance issues (prudent diet, regular exercise, health risks of tobacco and excessive alcohol, use of seatbelts, fire alarms in home, use of sunscreen).  Also reviewed age and gender appropriate health screening as well as vaccine recommendations. Vaccines: Tdap->declined.  Shingrix->declined.  Flu->declined. Labs: HP labs ordered Cervical ca screening: hx hysterectomy, followed by GYN MD.  Has appt 10/02/23. Breast ca screening: hx breast bx 2023--->benign, gets approp imaging via GYN. Colon ca screening:  colonoscopy 2020 with polyps ([place]), recall 11/2023.  2) hypertension. Blood pressures elevated at home and here today.  She takes half of a 50 mg losartan tab in the morning.  Occasionally she will take the other half later in the day when she feels like her blood pressure is up. I recommend she start taking the whole 50 mg losartan every day.  #3 hypercholesterolemia. She takes her Crestor 10 mg an average of 3 to 4 days a week due to periodic myalgias.  #4 morbid obesity, BMI 53. She is getting ready to try the Mediterranean diet. We discussed some ways to try to increase calorie burning exercise. I gave her the names of the various injection weight loss meds so she can ask her insurer about coverage.  #5 history of impaired fasting glucose. She will return for fasting glucose and hemoglobin A1c testing.  She will return for fasting labs: CBC with differential, c-Met, TSH, lipid panel, hemoglobin A1c.  An After Visit Summary was printed and given to the patient.  Return for 3 to 4-week follow-up hypertension.  Signed:  Santiago Bumpers, MD           08/24/2023

## 2023-08-24 NOTE — Patient Instructions (Addendum)
Ask your insurer about coverage for ozempic, trulicity, zepbound, and mounjaro. You can call and let me know if you'd like a prescription sent in for one of these. Increase losartan to a WHOLE 50mg  tab every day.  Health Maintenance, Female Adopting a healthy lifestyle and getting preventive care are important in promoting health and wellness. Ask your health care provider about: The right schedule for you to have regular tests and exams. Things you can do on your own to prevent diseases and keep yourself healthy. What should I know about diet, weight, and exercise? Eat a healthy diet  Eat a diet that includes plenty of vegetables, fruits, low-fat dairy products, and lean protein. Do not eat a lot of foods that are high in solid fats, added sugars, or sodium. Maintain a healthy weight Body mass index (BMI) is used to identify weight problems. It estimates body fat based on height and weight. Your health care provider can help determine your BMI and help you achieve or maintain a healthy weight. Get regular exercise Get regular exercise. This is one of the most important things you can do for your health. Most adults should: Exercise for at least 150 minutes each week. The exercise should increase your heart rate and make you sweat (moderate-intensity exercise). Do strengthening exercises at least twice a week. This is in addition to the moderate-intensity exercise. Spend less time sitting. Even light physical activity can be beneficial. Watch cholesterol and blood lipids Have your blood tested for lipids and cholesterol at 56 years of age, then have this test every 5 years. Have your cholesterol levels checked more often if: Your lipid or cholesterol levels are high. You are older than 56 years of age. You are at high risk for heart disease. What should I know about cancer screening? Depending on your health history and family history, you may need to have cancer screening at various ages.  This may include screening for: Breast cancer. Cervical cancer. Colorectal cancer. Skin cancer. Lung cancer. What should I know about heart disease, diabetes, and high blood pressure? Blood pressure and heart disease High blood pressure causes heart disease and increases the risk of stroke. This is more likely to develop in people who have high blood pressure readings or are overweight. Have your blood pressure checked: Every 3-5 years if you are 6-79 years of age. Every year if you are 79 years old or older. Diabetes Have regular diabetes screenings. This checks your fasting blood sugar level. Have the screening done: Once every three years after age 21 if you are at a normal weight and have a low risk for diabetes. More often and at a younger age if you are overweight or have a high risk for diabetes. What should I know about preventing infection? Hepatitis B If you have a higher risk for hepatitis B, you should be screened for this virus. Talk with your health care provider to find out if you are at risk for hepatitis B infection. Hepatitis C Testing is recommended for: Everyone born from [birth year] through 1964. Anyone with known risk factors for hepatitis C. Sexually transmitted infections (STIs) Get screened for STIs, including gonorrhea and chlamydia, if: You are sexually active and are younger than 56 years of age. You are older than 56 years of age and your health care provider tells you that you are at risk for this type of infection. Your sexual activity has changed since you were last screened, and you are at increased risk for chlamydia  or gonorrhea. Ask your health care provider if you are at risk. Ask your health care provider about whether you are at high risk for HIV. Your health care provider may recommend a prescription medicine to help prevent HIV infection. If you choose to take medicine to prevent HIV, you should first get tested for HIV. You should then be tested every 3  months for as [place] as you are taking the medicine. Pregnancy If you are about to stop having your period (premenopausal) and you may become pregnant, seek counseling before you get pregnant. Take 400 to 800 micrograms (mcg) of folic acid every day if you become pregnant. Ask for birth control (contraception) if you want to prevent pregnancy. Osteoporosis and menopause Osteoporosis is a disease in which the bones lose minerals and strength with aging. This can result in bone fractures. If you are 35 years old or older, or if you are at risk for osteoporosis and fractures, ask your health care provider if you should: Be screened for bone loss. Take a calcium or vitamin D supplement to lower your risk of fractures. Be given hormone replacement therapy (HRT) to treat symptoms of menopause. Follow these instructions at home: Alcohol use Do not drink alcohol if: Your health care provider tells you not to drink. You are pregnant, may be pregnant, or are planning to become pregnant. If you drink alcohol: Limit how much you have to: 0-1 drink a day. Know how much alcohol is in your drink. In the [place] one drink equals one 12 oz bottle of beer (355 mL), one 5 oz glass of wine (148 mL), or one 1 oz glass of hard liquor (44 mL). Lifestyle Do not use any products that contain nicotine or tobacco. These products include cigarettes, chewing tobacco, and vaping devices, such as e-cigarettes. If you need help quitting, ask your health care provider. Do not use street drugs. Do not share needles. Ask your health care provider for help if you need support or information about quitting drugs. General instructions Schedule regular health, dental, and eye exams. Stay current with your vaccines. Tell your health care provider if: You often feel depressed. You have ever been abused or do not feel safe at home. Summary Adopting a healthy lifestyle and getting preventive care are important in promoting health  and wellness. Follow your health care provider's instructions about healthy diet, exercising, and getting tested or screened for diseases. Follow your health care provider's instructions on monitoring your cholesterol and blood pressure. This information is not intended to replace advice given to you by your health care provider. Make sure you discuss any questions you have with your health care provider. Document Revised: 03/29/2021 Document Reviewed: 03/29/2021 Elsevier Patient Education  2024 [employer].

## 2023-12-01 ENCOUNTER — Ambulatory Visit: Payer: BC Managed Care – PPO | Admitting: Cardiology

## 2024-01-10 ENCOUNTER — Ambulatory Visit: Payer: Self-pay | Admitting: Cardiology

## 2024-01-10 NOTE — Progress Notes (Unsigned)
OFFICE VISIT  01/11/2024  CC:  Chief Complaint  Patient presents with   Headache    Pt is c/o headache, chills, cough, nasal congestion. Denies fever    Patient is a 58 y.o. female who presents for cough.  HPI: Onset 36 hours ago, general chills and fatigue.  Achy in the head and body in general. Mild sore throat, significant nasal congestion/PND and cough. No wheezing or shortness of breath or chest pain. No nausea, vomiting, or diarrhea.  Exposure to 2 cases of influenza A in her home recently. She works in the school system.  Past Medical History:  Diagnosis Date   Arthritis    Hand numbness    occ   Heart palpitations    History of colon polyps    Hypercholesterolemia    Hypertension    no longer taking medication for one year   OSA on CPAP    severe obstructive sleep apnea with an AHI of 84.6/h  now on auto CPAP from 5-13cm H2O   Urinary incontinence     Past Surgical History:  Procedure Laterality Date   ABDOMINAL HYSTERECTOMY Bilateral 05/03/2016   Procedure: HYSTERECTOMY ABDOMINAL;  Surgeon: Freddy Finner, MD;  Location: [facility];  Service: Gynecology;  Laterality: Bilateral;  bilateral salpingoopherectomy    BILATERAL SALPINGECTOMY  05/03/2016   Procedure: BILATERAL SALPINGECTOMY;  Surgeon: Freddy Finner, MD;  Location: [facility];  Service: Gynecology;;   BREAST BIOPSY Right 09/20/2022   [place] RT BREAST BX W LOC DEV 1ST LESION IMG BX SPEC US GUIDE 09/20/2022 [facility]   CESAREAN SECTION  11/10/86 09/06/88   x2   CHOLECYSTECTOMY  11/06/2012   Procedure: LAPAROSCOPIC CHOLECYSTECTOMY WITH INTRAOPERATIVE CHOLANGIOGRAM;  Surgeon: Wilmon Arms. Corliss Skains, MD;  Location: [facility];  Service: General;  Laterality: N/A;   COLONOSCOPY  2020   DILATION AND CURETTAGE OF UTERUS     DOBUTAMINE STRESS ECHO     2013 normal   EYE SURGERY     childhood   OOPHORECTOMY  05/03/2016   Procedure: BILATERAL OOPHORECTOMY;  Surgeon: Freddy Finner, MD;  Location: [facility];  Service: Gynecology;;   Sleep study     2024   TRANSTHORACIC ECHOCARDIOGRAM     01/20/23 NORMAL    Outpatient Medications Prior to Visit  Medication Sig Dispense Refill   methocarbamol (ROBAXIN) 500 MG tablet Take 1,000 mg by mouth 4 (four) times daily.     aspirin EC 81 MG tablet Take 81 mg by mouth daily. Reported on 03/16/2016     Cetirizine HCl (ZYRTEC ALLERGY PO) Take 10 mg by mouth as needed.     Cholecalciferol (VITAMIN D PO) Take 2,000 Units by mouth daily. Reported on 03/16/2016     fluticasone (FLONASE ALLERGY RELIEF) 50 MCG/ACT nasal spray Place 1 spray into both nostrils daily. Reported on 03/16/2016     loratadine (CLARITIN) 10 MG tablet Take 10 mg by mouth daily. Reported on 03/16/2016 (Patient not taking: Reported on 12/27/2022)     losartan (COZAAR) 50 MG tablet Take 50 mg by mouth daily.     rosuvastatin (CRESTOR) 10 MG tablet Take 10 mg by mouth at bedtime. (Patient not taking: Reported on 08/24/2023)     methylPREDNISolone (MEDROL DOSEPAK) 4 MG TBPK tablet TAKE 6 TABLETS ON DAY 1 AS DIRECTED ON PACKAGE AND DECREASE BY 1 TAB EACH DAY FOR A TOTAL OF 6 DAYS (Patient not taking: Reported on 01/11/2024)     No facility-administered medications prior to visit.  No Known Allergies  Review of Systems  As per HPI  PE:    01/11/2024   11:10 AM 08/24/2023    3:06 PM 08/24/2023    2:55 PM  Vitals with BMI  Height 5\' 6"   5\' 6"   Weight 331 lbs 13 oz  333 lbs 6 oz  BMI 53.58  53.84  Systolic 121 140 914  Diastolic 74 82 85  Pulse 82  76     Physical Exam  VS: noted--normal. Gen: alert, NAD, NONTOXIC APPEARING. HEENT: eyes without injection, drainage, or swelling.  Ears: EACs clear, TMs with normal light reflex and landmarks.  Nose: Clear rhinorrhea, with some dried, crusty exudate adherent to mildly injected mucosa.  No purulent d/c.  No paranasal sinus TTP.  No facial swelling.  Throat and mouth without focal lesion.  No pharyngial swelling, erythema, or exudate.    Neck: supple, no LAD.   LUNGS: CTA bilat, nonlabored resps.   CV: RRR, no m/r/g. EXT: no c/c/e SKIN: no rash   LABS:  Last CBC Lab Results  Component Value Date   WBC 6.0 02/19/2019   HGB 11.7 (L) 02/19/2019   HCT 37.3 02/19/2019   MCV 91.4 02/19/2019   MCH 28.7 02/19/2019   RDW 13.2 02/19/2019   PLT 206 02/19/2019   Last metabolic panel Lab Results  Component Value Date   GLUCOSE 134 (H) 02/19/2019   NA 140 02/19/2019   K 3.7 02/19/2019   CL 109 02/19/2019   CO2 24 02/19/2019   BUN 16 02/19/2019   CREATININE 0.69 02/19/2019   GFRNONAA >60 02/19/2019   CALCIUM 8.7 (L) 02/19/2019   PROT 6.5 02/19/2019   ALBUMIN 3.2 (L) 02/19/2019   BILITOT 0.3 02/19/2019   ALKPHOS 85 02/19/2019   AST 17 02/19/2019   ALT 16 02/19/2019   ANIONGAP 7 02/19/2019   IMPRESSION AND PLAN:  Influenza-like illness. Exposure to flu A and home. Influenza virus circulating through the community currently. She is within the antiviral treatment window so I am going to treat with Tamiflu even though her rapid flu testing here in the office today is negative. I also recommended Mucinex DM and saline nasal spray.  Rapid strep, flu A/B, and covid all neg today.  An After Visit Summary was printed and given to the patient.  FOLLOW UP: Return if symptoms worsen or fail to improve.  Signed:  Santiago Bumpers, MD           01/11/2024

## 2024-01-11 ENCOUNTER — Encounter: Payer: Self-pay | Admitting: Family Medicine

## 2024-01-11 ENCOUNTER — Ambulatory Visit: Payer: 59 | Admitting: Family Medicine

## 2024-01-11 VITALS — BP 121/74 | HR 82 | Temp 98.3°F | Ht 66.0 in | Wt 331.8 lb

## 2024-01-11 DIAGNOSIS — R051 Acute cough: Secondary | ICD-10-CM

## 2024-01-11 DIAGNOSIS — J111 Influenza due to unidentified influenza virus with other respiratory manifestations: Secondary | ICD-10-CM

## 2024-01-11 LAB — POCT INFLUENZA A/B
Influenza A, POC: NEGATIVE
Influenza B, POC: NEGATIVE

## 2024-01-11 LAB — POC COVID19 BINAXNOW: SARS Coronavirus 2 Ag: NEGATIVE

## 2024-01-11 LAB — POCT RAPID STREP A (OFFICE): Rapid Strep A Screen: NEGATIVE

## 2024-01-11 MED ORDER — OSELTAMIVIR PHOSPHATE 75 MG PO CAPS: 75.0000 mg | ORAL_CAPSULE | Freq: Two times a day (BID) | ORAL | 0 refills | Status: AC

## 2024-01-11 NOTE — Patient Instructions (Signed)
Take otc mucinex dm as directed on the packaging.  Use saline nasal spray multiple times throughout your day to clear and moisturize nasal passages.

## 2024-01-12 ENCOUNTER — Encounter: Payer: Self-pay | Admitting: Family Medicine

## 2024-03-04 ENCOUNTER — Other Ambulatory Visit: Payer: Self-pay | Admitting: Obstetrics & Gynecology

## 2024-03-04 DIAGNOSIS — R928 Other abnormal and inconclusive findings on diagnostic imaging of breast: Secondary | ICD-10-CM

## 2024-03-07 ENCOUNTER — Other Ambulatory Visit: Payer: Self-pay | Admitting: Obstetrics & Gynecology

## 2024-03-07 ENCOUNTER — Ambulatory Visit
Admission: RE | Admit: 2024-03-07 | Discharge: 2024-03-07 | Discharge status: Home / Self Care | Source: Ambulatory Visit | Attending: Obstetrics & Gynecology | Admitting: Obstetrics & Gynecology

## 2024-03-07 DIAGNOSIS — R928 Other abnormal and inconclusive findings on diagnostic imaging of breast: Secondary | ICD-10-CM

## 2024-03-07 DIAGNOSIS — N6310 Unspecified lump in the right breast, unspecified quadrant: Secondary | ICD-10-CM

## 2024-03-12 ENCOUNTER — Ambulatory Visit
Admission: RE | Admit: 2024-03-12 | Discharge: 2024-03-12 | Disposition: A | Discharge status: Home / Self Care | Source: Ambulatory Visit | Attending: Obstetrics & Gynecology | Admitting: Obstetrics & Gynecology

## 2024-03-12 ENCOUNTER — Ambulatory Visit
Admission: RE | Admit: 2024-03-12 | Discharge: 2024-03-12 | Discharge status: Home / Self Care | Source: Ambulatory Visit | Attending: Obstetrics & Gynecology | Admitting: Obstetrics & Gynecology

## 2024-03-12 DIAGNOSIS — N6310 Unspecified lump in the right breast, unspecified quadrant: Secondary | ICD-10-CM

## 2024-03-12 HISTORY — PX: BREAST BIOPSY: SHX20

## 2024-03-13 LAB — SURGICAL PATHOLOGY

## 2024-03-13 LAB — HM MAMMOGRAPHY

## 2024-03-20 ENCOUNTER — Telehealth: Payer: Self-pay | Admitting: Cardiology

## 2024-03-20 NOTE — Telephone Encounter (Signed)
 Patient would like to know if she still needs to be seen on 5/05. She says she hasn't worn CPAP since before December, 2024, but she knows she should and probably needs new equipment. I encouraged her to come in for appointment, but will check with MD to confirm. She says she just wants to be sure she isn't taking up time from a patient who may need to be seen more urgently. Please advise.

## 2024-03-20 NOTE — Telephone Encounter (Signed)
 Will forward this message to our sleep team for further management and follow-up with the pt.

## 2024-03-21 NOTE — Telephone Encounter (Signed)
 Called patient to keep her appointment.

## 2024-03-25 ENCOUNTER — Ambulatory Visit: Payer: Self-pay | Attending: Cardiology | Admitting: Cardiology

## 2024-03-25 ENCOUNTER — Encounter: Payer: Self-pay | Admitting: Cardiology

## 2024-03-25 VITALS — BP 128/66 | HR 76 | Ht 66.0 in | Wt 336.6 lb

## 2024-03-25 DIAGNOSIS — I10 Essential (primary) hypertension: Secondary | ICD-10-CM

## 2024-03-25 DIAGNOSIS — G4733 Obstructive sleep apnea (adult) (pediatric): Secondary | ICD-10-CM | POA: Diagnosis not present

## 2024-03-25 NOTE — Progress Notes (Signed)
 Date:  03/25/2024   ID:  Marissa Marks, DOB [birth year], MRN [id number] The patient was identified using 2 identifiers.  PCP:  Shelvia Dick, MD   [facility] Providers Cardiologist:  Dorothye Gathers, MD  Evaluation Performed:  New Patient Evaluation  Chief Complaint:  OSA  History of Present Illness:    Marissa Marks is a 58 y.o. female with this is a 58 year old morbidly obese female with a history of heart palpitations, hypertension and hyperlipidemia.  She was seen by Dr. 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  Due to her morbid obesity it was recommended that she have a sleep study done.  She underwent home sleep study showing severe obstructive sleep apnea with an AHI of 84.6/h with no significant central events.  She had nocturnal hypoxemia with O2 sats as low as 66% and less than 88% for 112 minutes during the study.  She underwent CPAP titration and was titrated to CPAP at 15 cm H2O.    Since I saw her last she has stopped using her CPAP device.  She says that she just is lazy in using it. Her mom lives with her and has Parkinson's and has violent dreams and so she has to get up and down with her mom during the night.  She then had all 4 wisdom teeth out and had issues after that.  She called in to get new supplies and tubing and was told that her insurance will not cover her supplies through her DME due to an insurance change to [employer]. She now needs a new DME to get new supplies.  Past Medical History:  Diagnosis Date   Arthritis    Hand numbness    occ   Heart palpitations    History of colon polyps    Hypercholesterolemia    Hypertension    no longer taking medication for one year   OSA on CPAP    severe obstructive sleep apnea with an AHI of 84.6/h  now on auto CPAP from 5-13cm H2O   Urinary incontinence    Past Surgical History:  Procedure Laterality Date   ABDOMINAL HYSTERECTOMY Bilateral 05/03/2016    Procedure: HYSTERECTOMY ABDOMINAL;  Surgeon: Leontine Rana, MD;  Location: [facility];  Service: Gynecology;  Laterality: Bilateral;  bilateral salpingoopherectomy    BILATERAL SALPINGECTOMY  05/03/2016   Procedure: BILATERAL SALPINGECTOMY;  Surgeon: Leontine Rana, MD;  Location: [facility];  Service: Gynecology;;   BREAST BIOPSY Right 09/20/2022   US  RT BREAST BX W LOC DEV 1ST LESION IMG BX SPEC US  GUIDE 09/20/2022 [facility]   BREAST BIOPSY Right 03/12/2024   US  RT BREAST BX W LOC DEV 1ST LESION IMG BX SPEC US  GUIDE 03/12/2024 [facility]   CESAREAN SECTION  11/10/86 09/06/88   x2   CHOLECYSTECTOMY  11/06/2012   Procedure: LAPAROSCOPIC CHOLECYSTECTOMY WITH INTRAOPERATIVE CHOLANGIOGRAM;  Surgeon: Kari Otto. Eli Grizzle, MD;  Location: [facility];  Service: General;  Laterality: N/A;   COLONOSCOPY  2020   DILATION AND CURETTAGE OF UTERUS     DOBUTAMINE  STRESS ECHO     2013 normal   EYE SURGERY     childhood   OOPHORECTOMY  05/03/2016   Procedure: BILATERAL OOPHORECTOMY;  Surgeon: Leontine Rana, MD;  Location: [facility];  Service: Gynecology;;   Sleep study     2024   TRANSTHORACIC ECHOCARDIOGRAM     01/20/23 NORMAL  No outpatient medications have been marked as taking for the 03/25/24 encounter (Appointment) with [name], MD.     Allergies:   Patient has no known allergies.   Social History   Tobacco Use   Smoking status: Never   Smokeless tobacco: Never  Vaping Use   Vaping status: Never Used  Substance Use Topics   Alcohol use: No   Drug use: No     Family Hx: The patient's family history includes Alcohol abuse in her paternal grandfather; Arthritis in her father, maternal grandmother, mother, and paternal grandmother; Cancer in her brother; Diabetes in her father; Early death in her brother, maternal grandfather, and paternal grandfather; Hearing loss in her paternal grandmother; Heart attack in her father, maternal grandfather, maternal grandmother,  paternal grandfather, and paternal grandmother; Heart disease in her father, maternal grandfather, maternal grandmother, paternal grandfather, and paternal grandmother; High Cholesterol in her father and paternal grandfather; High blood pressure in her father and paternal grandmother; Mental illness in her maternal grandfather and paternal grandmother; Ovarian cancer in her paternal grandmother; Parkinson's disease in her mother.  ROS:   Please see the history of present illness.     All other systems reviewed and are negative.   Prior Sleep studies:   The following studies were reviewed today:  Home sleep study, PAP titration and PAP compliance download  Labs/Other Tests and Data Reviewed:     Recent Labs: No results found for requested labs within last 365 days.    Wt Readings from Last 3 Encounters:  01/11/24 (!) 331 lb 12.8 oz (150.5 kg)  08/24/23 (!) 333 lb 6.4 oz (151.2 kg)  08/01/23 (!) 325 lb (147.4 kg)     Risk Assessment/Calculations:      STOP-Bang Score:         Objective:    Vital Signs:  LMP 04/14/2016 (Exact Date) Comment: still bleeding  GEN: Well nourished, well developed in no acute distress HEENT: Normal NECK: No JVD; No carotid bruits LYMPHATICS: No lymphadenopathy CARDIAC:RRR, no murmurs, rubs, gallops RESPIRATORY:  Clear to auscultation without rales, wheezing or rhonchi  ABDOMEN: Soft, non-tender, non-distended MUSCULOSKELETAL:  No edema; No deformity  SKIN: Warm and dry NEUROLOGIC:  Alert and oriented x 3 PSYCHIATRIC:  Normal affect  ASSESSMENT & PLAN:    OSA -  -Since I saw her last she has stopped using her CPAP device.  She says that she just is lazy in using it. Her mom lives with her and has Parkinson's and has violent dreams and so she has to get up and down with her mom during the night.  She then had all 4 wisdom teeth out and had issues after that.  She called in to get new supplies and tubing and was told that her insurance will not  cover her supplies through her DME due to an insurance change to [employer]. She now needs a new DME to get new supplies. 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  Concerns regarding medicines are outlined above.   Tests Ordered: No orders of the defined types were placed in this encounter.   Medication Changes: No orders of the defined types were placed in this encounter.   Follow Up:  in 3 months  Signed, Gaylyn Keas, MD  03/25/2024 2:58 PM    [facility]

## 2024-03-25 NOTE — Patient Instructions (Signed)
 Medication Instructions:  Your physician recommends that you continue on your current medications as directed. Please refer to the Current Medication list given to you today.  *If you need a refill on your cardiac medications before your next appointment, please call your pharmacy*  Lab Work: NONE If you have labs (blood work) drawn today and your tests are completely normal, you will receive your results only by: MyChart Message (if you have MyChart) OR A paper copy in the mail If you have any lab test that is abnormal or we need to change your treatment, we will call you to review the results.  Testing/Procedures: NONE  Follow-Up: At [facility], you and your health needs are our priority.  As part of our continuing mission to provide you with exceptional heart care, our providers are all part of one team.  This team includes your primary Cardiologist (physician) and Advanced Practice Providers or APPs (Physician Assistants and Nurse Practitioners) who all work together to provide you with the care you need, when you need it.  Your next appointment:   8 week(s)  Provider:   Micael Adas, MD   We recommend signing up for the patient portal called "MyChart".  Sign up information is provided on this After Visit Summary.  MyChart is used to connect with patients for Virtual Visits (Telemedicine).  Patients are able to view lab/test results, encounter notes, upcoming appointments, etc.  Non-urgent messages can be sent to your provider as well.   To learn more about what you can do with MyChart, go to [email or web address].   Other Instructions

## 2024-03-27 ENCOUNTER — Other Ambulatory Visit: Payer: Self-pay

## 2024-03-27 DIAGNOSIS — R0683 Snoring: Secondary | ICD-10-CM

## 2024-03-27 DIAGNOSIS — I10 Essential (primary) hypertension: Secondary | ICD-10-CM

## 2024-03-27 DIAGNOSIS — G4733 Obstructive sleep apnea (adult) (pediatric): Secondary | ICD-10-CM

## 2024-03-27 DIAGNOSIS — R0602 Shortness of breath: Secondary | ICD-10-CM

## 2024-03-27 DIAGNOSIS — R002 Palpitations: Secondary | ICD-10-CM

## 2024-03-27 NOTE — Progress Notes (Signed)
 Supplies for CPAP ordered through [facility] 03/27/24

## 2024-04-21 DIAGNOSIS — C4491 Basal cell carcinoma of skin, unspecified: Secondary | ICD-10-CM

## 2024-04-21 HISTORY — DX: Basal cell carcinoma of skin, unspecified: C44.91

## 2024-05-10 LAB — HM COLONOSCOPY

## 2024-05-16 ENCOUNTER — Encounter: Payer: Self-pay | Admitting: Cardiology

## 2024-05-16 ENCOUNTER — Ambulatory Visit: Attending: Cardiology | Admitting: Cardiology

## 2024-05-16 VITALS — BP 144/72 | HR 69 | Ht 66.0 in | Wt 331.6 lb

## 2024-05-16 DIAGNOSIS — I10 Essential (primary) hypertension: Secondary | ICD-10-CM | POA: Diagnosis not present

## 2024-05-16 DIAGNOSIS — G4733 Obstructive sleep apnea (adult) (pediatric): Secondary | ICD-10-CM

## 2024-05-16 NOTE — Progress Notes (Addendum)
 Date:  05/16/2024   ID:  Marissa Marks, Shackett 16-Oct-1966, MRN [id number] The patient was identified using 2 identifiers.  PCP:  Candise Aleene DEL, MD   [facility] Providers Cardiologist:  Oneil Parchment, MD  Evaluation Performed:  New Patient Evaluation  Chief Complaint:  OSA  History of Present Illness:    Marissa Marks is a 58 y.o. female with this is a 58 year old morbidly obese female with a history of heart palpitations, hypertension and hyperlipidemia.  She was seen by Dr. 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  Due to her morbid obesity it was recommended that she have a sleep study done.  She underwent home sleep study showing severe obstructive sleep apnea with an AHI of 84.6/h with no significant central events.  She had nocturnal hypoxemia with O2 sats as low as 66% and less than 88% for 112 minutes during the study.  She underwent CPAP titration and was titrated to CPAP at 15 cm H2O.    I saw her last she had stopped using her CPAP device.  She said that she just is lazy in using it. Her mom lives with her and has Parkinson's and has violent dreams and so she has to get up and down with her mom during the night.  She also had run out of supplies and needed a new DME.  I ordered new supplies for her and she is now back for follow-up.  She just started using her device again about 3-4 nights ago.  She just got a new mask which she loves.  She has worn it for 6 hours nightly this week.  She has felt rested when she gets up in the am and has not had to take a nap during the day this week.  Denies any mouth or nose dryness.   Past Medical History:  Diagnosis Date   Arthritis    Hand numbness    occ   Heart palpitations    History of colon polyps    Hypercholesterolemia    Hypertension    no longer taking medication for one year   OSA on CPAP    severe obstructive sleep apnea with an AHI of 84.6/h  now on auto CPAP from  5-13cm H2O   Urinary incontinence    Past Surgical History:  Procedure Laterality Date   ABDOMINAL HYSTERECTOMY Bilateral 05/03/2016   Procedure: HYSTERECTOMY ABDOMINAL;  Surgeon: LELON Tanda Mulch, MD;  Location: [facility];  Service: Gynecology;  Laterality: Bilateral;  bilateral salpingoopherectomy    BILATERAL SALPINGECTOMY  05/03/2016   Procedure: BILATERAL SALPINGECTOMY;  Surgeon: LELON Tanda Mulch, MD;  Location: [facility];  Service: Gynecology;;   BREAST BIOPSY Right 09/20/2022   US  RT BREAST BX W LOC DEV 1ST LESION IMG BX SPEC US  GUIDE 09/20/2022 [facility]   BREAST BIOPSY Right 03/12/2024   US  RT BREAST BX W LOC DEV 1ST LESION IMG BX SPEC US  GUIDE 03/12/2024 [facility]   CESAREAN SECTION  11/10/86 09/06/88   x2   CHOLECYSTECTOMY  11/06/2012   Procedure: LAPAROSCOPIC CHOLECYSTECTOMY WITH INTRAOPERATIVE CHOLANGIOGRAM;  Surgeon: Donnice POUR. Belinda, MD;  [facility]:  [facility];  Service: General;  Laterality: N/A;   COLONOSCOPY  2020   DILATION AND CURETTAGE OF UTERUS     DOBUTAMINE STRESS ECHO     2013 normal   EYE SURGERY     childhood   OOPHORECTOMY  05/03/2016  Procedure: BILATERAL OOPHORECTOMY;  Surgeon: LELON Tanda Mulch, MD;  Location: [facility];  Service: Gynecology;;   Sleep study     2024   TRANSTHORACIC ECHOCARDIOGRAM     01/20/23 NORMAL     Current Meds  Medication Sig   aspirin EC 81 MG tablet Take 81 mg by mouth daily. Reported on 03/16/2016   Cetirizine HCl (ZYRTEC ALLERGY PO) Take 10 mg by mouth as needed.   Cholecalciferol (VITAMIN D PO) Take 2,000 Units by mouth daily. Reported on 03/16/2016   fluticasone (FLONASE ALLERGY RELIEF) 50 MCG/ACT nasal spray Place 1 spray into both nostrils daily. Reported on 03/16/2016   losartan (COZAAR) 50 MG tablet Take 50 mg by mouth daily.   methocarbamol (ROBAXIN) 500 MG tablet Take 1,000 mg by mouth 4 (four) times daily.     Allergies:   Patient has no known allergies.   Social History   Tobacco Use   Smoking  status: Never   Smokeless tobacco: Never  Vaping Use   Vaping status: Never Used  Substance Use Topics   Alcohol use: No   Drug use: No     Family Hx: The patient's family history includes Alcohol abuse in her paternal grandfather; Arthritis in her father, maternal grandmother, mother, and paternal grandmother; Cancer in her brother; Diabetes in her father; Early death in her brother, maternal grandfather, and paternal grandfather; Hearing loss in her paternal grandmother; Heart attack in her father, maternal grandfather, maternal grandmother, paternal grandfather, and paternal grandmother; Heart disease in her father, maternal grandfather, maternal grandmother, paternal grandfather, and paternal grandmother; High Cholesterol in her father and paternal grandfather; High blood pressure in her father and paternal grandmother; Mental illness in her maternal grandfather and paternal grandmother; Ovarian cancer in her paternal grandmother; Parkinson's disease in her mother.  ROS:   Please see the history of present illness.     All other systems reviewed and are negative.   Prior Sleep studies:   The following studies were reviewed today:  Home sleep study, PAP titration and PAP compliance download  Labs/Other Tests and Data Reviewed:    EKG Interpretation Date/Time:  Thursday May 16 2024 08:40:59 EDT Ventricular Rate:  69 PR Interval:  158 QRS Duration:  98 QT Interval:  410 QTC Calculation: 439 R Axis:   -42  Text Interpretation: Normal sinus rhythm Left axis deviation Minimal voltage criteria for LVH, may be normal variant ( Cornell product ) When compared with ECG of 19-Feb-2019 01:23, PREVIOUS ECG IS PRESENT Confirmed by Shlomo Corning ([id number]) on 05/16/2024 8:50:51 AM   Recent Labs: No results found for requested labs within last 365 days.    Wt Readings from Last 3 Encounters:  05/16/24 (!) 331 lb 9.6 oz (150.4 kg)  03/25/24 (!) 336 lb 9.6 oz (152.7 kg)  01/11/24 (!) 331 lb  12.8 oz (150.5 kg)     Objective:    Vital Signs:  BP (!) 144/72   Pulse 69   Ht 5' 6 (1.676 m)   Wt (!) 331 lb 9.6 oz (150.4 kg)   LMP 04/14/2016 (Exact Date) Comment: still bleeding  SpO2 97%   BMI 53.52 kg/m   GEN: Well nourished, well developed in no acute distress HEENT: Normal NECK: No JVD; No carotid bruits LYMPHATICS: No lymphadenopathy CARDIAC:RRR, no murmurs, rubs, gallops RESPIRATORY:  Clear to auscultation without rales, wheezing or rhonchi  ABDOMEN: Soft, non-tender, non-distended MUSCULOSKELETAL:  No edema; No deformity  SKIN: Warm and dry NEUROLOGIC:  Alert and oriented x  3 PSYCHIATRIC:  Normal affect  ASSESSMENT & PLAN:    OSA - The patient is tolerating PAP therapy well without any problems. The PAP download performed by his DME was personally reviewed and interpreted by me today and showed an AHI of 0.8 /hr on auto CPAP from 5-13 cm H2O with 7% compliance in using more than 4 hours nightly.  The patient has been using and benefiting from PAP use and will continue to benefit from therapy.  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  Concerns regarding medicines are outlined above.   Tests Ordered: Orders Placed This Encounter  Procedures   EKG 12-Lead    Medication Changes: No orders of the defined types were placed in this encounter.   Follow Up:  in 3 months  Signed, Wilbert Bihari, MD  05/16/2024 8:48 AM    [facility]

## 2024-05-16 NOTE — Patient Instructions (Signed)

## 2024-06-07 ENCOUNTER — Telehealth: Payer: Self-pay

## 2024-06-07 NOTE — Telephone Encounter (Signed)
-----   Message from Wilbert Bihari sent at 05/16/2024  9:08 AM EDT ----- Download in 4 weeks

## 2024-06-07 NOTE — Telephone Encounter (Signed)
 Recent download obtained for CPAP Therapy and sent to Dr. Shlomo.

## 2024-06-19 ENCOUNTER — Telehealth: Payer: Self-pay

## 2024-06-19 ENCOUNTER — Encounter: Payer: Self-pay | Admitting: Family Medicine

## 2024-06-19 ENCOUNTER — Ambulatory Visit: Admitting: Family Medicine

## 2024-06-19 VITALS — BP 144/85 | HR 74 | Temp 98.1°F | Ht 66.0 in | Wt 331.4 lb

## 2024-06-19 DIAGNOSIS — M19042 Primary osteoarthritis, left hand: Secondary | ICD-10-CM

## 2024-06-19 DIAGNOSIS — E7800 Pure hypercholesterolemia, unspecified: Secondary | ICD-10-CM

## 2024-06-19 DIAGNOSIS — R7301 Impaired fasting glucose: Secondary | ICD-10-CM | POA: Diagnosis not present

## 2024-06-19 DIAGNOSIS — M79644 Pain in right finger(s): Secondary | ICD-10-CM | POA: Diagnosis not present

## 2024-06-19 DIAGNOSIS — M79645 Pain in left finger(s): Secondary | ICD-10-CM

## 2024-06-19 DIAGNOSIS — M19041 Primary osteoarthritis, right hand: Secondary | ICD-10-CM

## 2024-06-19 DIAGNOSIS — I10 Essential (primary) hypertension: Secondary | ICD-10-CM | POA: Diagnosis not present

## 2024-06-19 LAB — SEDIMENTATION RATE: Sed Rate: 32 mm/h — ABNORMAL HIGH (ref 0–30)

## 2024-06-19 LAB — COMPREHENSIVE METABOLIC PANEL WITH GFR
ALT: 12 U/L (ref 0–35)
AST: 11 U/L (ref 0–37)
Albumin: 4.2 g/dL (ref 3.5–5.2)
Alkaline Phosphatase: 92 U/L (ref 39–117)
BUN: 13 mg/dL (ref 6–23)
CO2: 30 meq/L (ref 19–32)
Calcium: 9.5 mg/dL (ref 8.4–10.5)
Chloride: 103 meq/L (ref 96–112)
Creatinine, Ser: 0.66 mg/dL (ref 0.40–1.20)
GFR: 97.65 mL/min (ref >60.00)
Glucose, Bld: 85 mg/dL (ref 70–99)
Potassium: 4.2 meq/L (ref 3.5–5.1)
Sodium: 142 meq/L (ref 135–145)
Total Bilirubin: 0.4 mg/dL (ref 0.2–1.2)
Total Protein: 6.9 g/dL (ref 6.0–8.3)

## 2024-06-19 LAB — C-REACTIVE PROTEIN: CRP: 1.2 mg/dL (ref 0.5–20.0)

## 2024-06-19 MED ORDER — OLMESARTAN MEDOXOMIL-HCTZ 20-12.5 MG PO TABS: 1.0000 | ORAL_TABLET | Freq: Every day | ORAL | 0 refills | Status: DC

## 2024-06-19 NOTE — Progress Notes (Signed)
 OFFICE VISIT  06/19/2024  CC:  Chief Complaint  Patient presents with   Medical Management of Chronic Issues    Elevated BP concern; pt uses wrist cuff at home, 1 of the readings was 171/100. Medication is not lasting for the whole day, period of time remains elevated, gets HA at times, drinks plenty of water    Patient is a 58 y.o. female who presents for high blood pressure concerns.  HPI: Recently has noted blood pressures up into the 170/100 range.  Has a wrist cuff.  She feels a slight headache and feels tense--> this always let her know that her blood pressure is high. She takes a whole 50 mg losartan tab daily. Usually her significant elevation of blood pressure comes in the late afternoon and evenings.  Blood pressure 144/72 at her visit with Dr. Randine Bihari on 05/16/2024.  She had not taken her blood pressure yet that day.  She has been using her CPAP at least 6 hours a night.  Lately has had worsening of her chronic pain in her fingers.  Most recently it is the left hand middle finger at the DIP joint.  It is red and swollen and very stiff.  It takes her about 30 minutes in the mornings to get where she cannot completely close her fingers and grip. Similar occurrence in the right hand middle finger DIP but much less severe lately. She has a little bit of chronic pain in the medial aspect of the left knee.  Also some impingement type pain in the right shoulder.  Otherwise her joints do not give her much problem.  No myalgias.  No rash.  No oral ulcers.  No fevers.  No abnormal weight loss.  Past Medical History:  Diagnosis Date   Arthritis    Basal cell carcinoma 04/2024   Hand numbness    occ   Heart palpitations    History of colon polyps    Hypercholesterolemia    Hypertension    OSA on CPAP    severe obstructive sleep apnea with an AHI of 84.6/h  now on auto CPAP from 5-13cm H2O   Urinary incontinence     Past Surgical History:  Procedure Laterality Date    ABDOMINAL HYSTERECTOMY Bilateral 05/03/2016   Procedure: HYSTERECTOMY ABDOMINAL;  Surgeon: LELON Tanda Mulch, MD;  Location: [facility];  Service: Gynecology;  Laterality: Bilateral;  bilateral salpingoopherectomy    BILATERAL SALPINGECTOMY  05/03/2016   Procedure: BILATERAL SALPINGECTOMY;  Surgeon: LELON Tanda Mulch, MD;  Location[facility];  Service: Gynecology;;   BREAST BIOPSY Right 09/20/2022   US  RT BREAST BX W LOC DEV 1ST LESION IMG BX SPEC US  GUIDE 09/20/2022 [facility]   BREAST BIOPSY Right 03/12/2024   US  RT BREAST BX W LOC DEV 1ST LESION IMG BX SPEC US  GUIDE 03/12/2024 [facility]   CESAREAN SECTION  11/10/86 09/06/88   x2   CHOLECYSTECTOMY  11/06/2012   Procedure: LAPAROSCOPIC CHOLECYSTECTOMY WITH INTRAOPERATIVE CHOLANGIOGRAM;  Surgeon: Donnice POUR. Belinda, MD;  Location: [facility];  Service: General;  Laterality: N/A;   COLONOSCOPY  2020   DILATION AND CURETTAGE OF UTERUS     DOBUTAMINE STRESS ECHO     2013 normal   EYE SURGERY     childhood   OOPHORECTOMY  05/03/2016   Procedure: BILATERAL OOPHORECTOMY;  Surgeon: LELON Tanda Mulch, MD;  Location: [facility];  Service: Gynecology;;   Sleep study     2024   TRANSTHORACIC ECHOCARDIOGRAM  01/20/23 NORMAL    Outpatient Medications Prior to Visit  Medication Sig Dispense Refill   aspirin EC 81 MG tablet Take 81 mg by mouth daily. Reported on 03/16/2016     Cetirizine HCl (ZYRTEC ALLERGY PO) Take 10 mg by mouth as needed.     Cholecalciferol (VITAMIN D PO) Take 2,000 Units by mouth daily. Reported on 03/16/2016     fluticasone (FLONASE ALLERGY RELIEF) 50 MCG/ACT nasal spray Place 1 spray into both nostrils daily. Reported on 03/16/2016     methocarbamol (ROBAXIN) 500 MG tablet Take 1,000 mg by mouth 4 (four) times daily.     losartan (COZAAR) 50 MG tablet Take 50 mg by mouth daily.     loratadine (CLARITIN) 10 MG tablet Take 10 mg by mouth daily. Reported on 03/16/2016 (Patient not taking: Reported on 06/19/2024)      rosuvastatin (CRESTOR) 10 MG tablet Take 10 mg by mouth at bedtime. (Patient not taking: Reported on 06/19/2024)     No facility-administered medications prior to visit.    No Known Allergies  Review of Systems  As per HPI  PE:    06/19/2024   10:37 AM 05/16/2024    8:44 AM 03/25/2024    3:40 PM  Vitals with BMI  Height 5' 6 5' 6 5' 6  Weight 331 lbs 6 oz 331 lbs 10 oz 336 lbs 10 oz  BMI 53.51 53.55 54.35  Systolic 144 144 871  Diastolic 85 72 66  Pulse 74 69 76   Physical Exam  General: Alert and well-appearing. Affect is pleasant, thought and speech are lucid. Cardiovascular: Regular rhythm and rate without murmur.  Lungs are clear bilaterally, breathing unlabored. Extremities show no pitting edema. She has a few fingers with mild DIP bony hypertrophy.  Left middle finger DIP is tender and slightly swollen and erythematous. Otherwise her fingers are without tenderness or acute swelling.  She has mild stiffness with grip but it is fully intact.  LABS:  Last metabolic panel Lab Results  Component Value Date   GLUCOSE 134 (H) 02/19/2019   NA 140 02/19/2019   K 3.7 02/19/2019   CL 109 02/19/2019   CO2 24 02/19/2019   BUN 16 02/19/2019   CREATININE 0.69 02/19/2019   GFRNONAA >60 02/19/2019   CALCIUM 8.7 (L) 02/19/2019   PROT 6.5 02/19/2019   ALBUMIN 3.2 (L) 02/19/2019   BILITOT 0.3 02/19/2019   ALKPHOS 85 02/19/2019   AST 17 02/19/2019   ALT 16 02/19/2019   ANIONGAP 7 02/19/2019   IMPRESSION AND PLAN:  #1 uncontrolled hypertension. Stop losartan. Start telmisartan-HCTZ 20/12.5, 1 tab daily.  If blood pressure not less than 140/90 in 2 days then increase to 2 tabs daily. Basic metabolic panel today.  2.  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 Will obtain plain radiographs of hands.  Check sed rate and CRP today. No medications prescribed for this today.  An  After Visit Summary was printed and given to the patient.  FOLLOW UP: Return in about 1 week (around 06/26/2024) for f/u bp (fasting).  Signed:  Gerlene Hockey, MD           06/19/2024

## 2024-06-19 NOTE — Telephone Encounter (Addendum)
 Gave patient education on good sleep compliance. ----- Message from Marissa Marks sent at 06/07/2024  3:51 PM EDT ----- Regarding: RE: 4 week download Good AHI but needs to improve compliance ----- Message ----- From: Bevely Connell BROCKS, LPN Sent: 2/81/7974  10:14 AM EDT To: Marissa JONELLE Bihari, MD Subject: 4 week download                                [name], [name] 05/08/2024 - 06/06/2024 DOB: August 01, 1966 Age: 58 years [place] [address] [place] , [address] Phone: [phone] Fax: [phone] Email: [email or web address] Compliance Report Compliance Payor Standard Usage 05/08/2024 - 06/06/2024 Usage days 11/30 days (37%) >= 4 hours 10 days (33%) < 4 hours 1 days (3%) Usage hours 68 hours 27 minutes Average usage (total days) 2 hours 17 minutes Average usage (days used) 6 hours 13 minutes Median usage (days used) 6 hours 19 minutes Total used hours (value since last reset - 06/06/2024) 205 hours AirSense 11 AutoSet Serial number [id number] Mode AutoSet for Her Min Pressure 5 cmH2O Max Pressure 13 cmH2O EPR Fulltime EPR level 3 Therapy Pressure - cmH2O Median: 11.6 95th percentile: 12.9 Maximum: 13.0 Leaks - L/min Median: 0.1 95th percentile: 8.5 Maximum: 12.2 Events per hour AI: 0.7 HI: 0.6 AHI: 1.3 Apnea Index Central: 0.1 Obstructive: 0.6 Unknown: 0.0 RERA Index 1.2 Cheyne-Stokes respiration (average duration per night) 0 minutes (0%) Usage - hours Printed on 06/07/2024 - ResMed AirView version 4.49.0-5.0 Page 1 of 1 Marks, Marissa 05/08/2024 - 06/06/2024 DOB: December 26, 1965 Age: 58 years [place] [address] [place]  , [address] Phone: [phone] Fax: [phone] Email: [email or web address] Therapy Report AirSense 11 AutoSet SN: [id number] Usage (hours) Usage days 11/30 (37%) >= 4 hour days 10 (33%) < 4 hour days 1 (3%) Days not used 19 (63%) Days no data 0 (0%) Used/day (avg.) 6.2  hrs. Leak (L/min) Set threshold 24.0 L/min Maximum (avg) 12.2 95th % (avg) 8.5 Median (avg) 0.1 AirFit F30 Medium Pressure (cmH2O) Mode AutoSet for Her Set EPR Fulltime, 3.0 Set Max Pressure 13.0 Set Min Pressure 5.0 Maximum (avg) 13.0 95th % (avg) 12.9 Median (avg) 11.6 AHI (events/hour) AHI 1.3 HI 0.6 AI 0.7 CAI 0.1 OAI 0.6 UAI 0.0 RERA 1.2 CSR% (avg) 0.0 Printed on 06/07/2024 - ResMed AirView version 4.49.0-5.0 Page 1 of 3 ----- Message ----- From: Marks Marissa JONELLE, MD Sent: 05/16/2024   9:08 AM EDT To: Cv Div Sleep Studies  Download in 4 weeks

## 2024-06-19 NOTE — Patient Instructions (Signed)
 Check your blood pressure every morning and evening and write the numbers down to review with me in 1 wk.  Take your new bp med 1 tab every morning.  If your blood pressure remains >140/90 then increase to TWO tabs once a day.

## 2024-06-20 ENCOUNTER — Ambulatory Visit (HOSPITAL_BASED_OUTPATIENT_CLINIC_OR_DEPARTMENT_OTHER)
Admission: RE | Admit: 2024-06-20 | Discharge: 2024-06-20 | Disposition: A | Discharge status: Home / Self Care | Source: Ambulatory Visit | Attending: Family Medicine | Admitting: Family Medicine

## 2024-06-20 ENCOUNTER — Ambulatory Visit: Payer: Self-pay | Admitting: Family Medicine

## 2024-06-20 DIAGNOSIS — M79644 Pain in right finger(s): Secondary | ICD-10-CM | POA: Diagnosis present

## 2024-06-20 DIAGNOSIS — M79645 Pain in left finger(s): Secondary | ICD-10-CM | POA: Diagnosis present

## 2024-06-25 ENCOUNTER — Encounter: Payer: Self-pay | Admitting: Family Medicine

## 2024-06-26 ENCOUNTER — Encounter: Payer: Self-pay | Admitting: Cardiology

## 2024-06-27 ENCOUNTER — Encounter: Payer: Self-pay | Admitting: Family Medicine

## 2024-06-27 ENCOUNTER — Ambulatory Visit: Admitting: Family Medicine

## 2024-06-27 VITALS — BP 134/81 | HR 71 | Temp 97.5°F | Ht 66.0 in | Wt 328.4 lb

## 2024-06-27 DIAGNOSIS — M19042 Primary osteoarthritis, left hand: Secondary | ICD-10-CM | POA: Diagnosis not present

## 2024-06-27 DIAGNOSIS — I10 Essential (primary) hypertension: Secondary | ICD-10-CM

## 2024-06-27 DIAGNOSIS — R7301 Impaired fasting glucose: Secondary | ICD-10-CM

## 2024-06-27 DIAGNOSIS — M19041 Primary osteoarthritis, right hand: Secondary | ICD-10-CM

## 2024-06-27 DIAGNOSIS — E7800 Pure hypercholesterolemia, unspecified: Secondary | ICD-10-CM

## 2024-06-27 NOTE — Progress Notes (Signed)
 OFFICE VISIT  06/27/2024  CC:  Chief Complaint  Patient presents with   Medical Management of Chronic Issues    1 week f/u    Patient is a 58 y.o. female who presents for 1 week follow-up uncontrolled hypertension and hand arthritis. A/P as of last visit: 1 uncontrolled hypertension. Stop losartan. Start telmisartan-HCTZ 20/12.5, 1 tab daily.  If blood pressure not less than 140/90 in 2 days then is increase to 2 tabs daily. Basic metabolic panel today.   2.  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 Will obtain plain radiographs of hands.  Check sed rate and CRP today. No medications prescribed for this today.  INTERIM HX: The pain in her fingers remains prominent.  Particularly DIPs of all fingers, left hand worse the right lately.  Significant stiffness bilaterally.  She feels like she has insufficient strength and dexterity in her hands when trying to grip something.  Specifically, gripping the steering wheel of a vehicle.  She works part-time for her school system driving a bus and feels like this hand pain and stiffness has reached the point of making it too dangerous.  She did not start antihypertensive due to some fear of potential side effect from the HCTZ and some intense life circumstances driving her attention to others more in the last week.  Past Medical History:  Diagnosis Date   Arthritis    Basal cell carcinoma 04/2024   Hand numbness    occ   Heart palpitations    History of colon polyps    Hypercholesterolemia    Hypertension    OSA on CPAP    severe obstructive sleep apnea with an AHI of 84.6/h  now on auto CPAP from 5-13cm H2O   Urinary incontinence     Past Surgical History:  Procedure Laterality Date   ABDOMINAL HYSTERECTOMY Bilateral 05/03/2016   Procedure: HYSTERECTOMY ABDOMINAL;  Surgeon: LELON Tanda Mulch, MD;  Location: [facility];  Service: Gynecology;   Laterality: Bilateral;  bilateral salpingoopherectomy    BILATERAL SALPINGECTOMY  05/03/2016   Procedure: BILATERAL SALPINGECTOMY;  Surgeon: LELON Tanda Mulch, MD;  Location: [facility];  Service: Gynecology;;   BREAST BIOPSY Right 09/20/2022   US  RT BREAST BX W LOC DEV 1ST LESION IMG BX SPEC US  GUIDE 09/20/2022 [facility]   BREAST BIOPSY Right 03/12/2024   US  RT BREAST BX W LOC DEV 1ST LESION IMG BX SPEC US  GUIDE 03/12/2024 [facility]   CESAREAN SECTION  11/10/86 09/06/88   x2   CHOLECYSTECTOMY  11/06/2012   Procedure: LAPAROSCOPIC CHOLECYSTECTOMY WITH INTRAOPERATIVE CHOLANGIOGRAM;  Surgeon: Donnice POUR. Belinda, MD;  Location:  [facility];  Service: General;  Laterality: N/A;   COLONOSCOPY  2020   DILATION AND CURETTAGE OF UTERUS     DOBUTAMINE STRESS ECHO     2013 normal   EYE SURGERY     childhood   OOPHORECTOMY  05/03/2016   Procedure: BILATERAL OOPHORECTOMY;  Surgeon: LELON Tanda Mulch, MD;  Location: [facility];  Service: Gynecology;;   Sleep study     2024   TRANSTHORACIC ECHOCARDIOGRAM     01/20/23 NORMAL    Outpatient Medications Prior to Visit  Medication Sig Dispense Refill   aspirin EC 81 MG tablet Take 81 mg by mouth daily. Reported on 03/16/2016     Cetirizine HCl (ZYRTEC ALLERGY PO) Take 10 mg by mouth as needed.     Cholecalciferol (VITAMIN D  PO) Take 2,000 Units by mouth daily. Reported on 03/16/2016     fluticasone (FLONASE ALLERGY RELIEF) 50 MCG/ACT nasal spray Place 1 spray into both nostrils daily. Reported on 03/16/2016     methocarbamol (ROBAXIN) 500 MG tablet Take 1,000 mg by mouth 4 (four) times daily.     loratadine (CLARITIN) 10 MG tablet Take 10 mg by mouth daily. Reported on 03/16/2016 (Patient not taking: Reported on 06/27/2024)     olmesartan -hydrochlorothiazide (BENICAR  HCT) 20-12.5 MG tablet Take 1 tablet by mouth daily. (Patient not taking: Reported on 06/27/2024) 30 tablet 0   rosuvastatin (CRESTOR) 10 MG tablet Take 10 mg by mouth at bedtime.  (Patient not taking: Reported on 06/27/2024)     No facility-administered medications prior to visit.    No Known Allergies  Review of Systems As per HPI  PE:    06/27/2024    8:52 AM 06/19/2024   10:37 AM 05/16/2024    8:44 AM  Vitals with BMI  Height 5' 6 5' 6 5' 6  Weight 328 lbs 6 oz 331 lbs 6 oz 331 lbs 10 oz  BMI 53.03 53.51 53.55  Systolic 134 144 855  Diastolic 81 85 72  Pulse 71 74 69     Physical Exam  Gen: Alert, well appearing.  Patient is oriented to person, place, time, and situation. Mild deformity at DIPs of both hands, mild TTP distal IP jts prominently at 2nd and 3rd digits of L hand. Generalized stiffness of all fingers, weak grip bilat. No erythema of joints.  LABS:  Last CBC Lab Results  Component Value Date   WBC 6.0 02/19/2019   HGB 11.7 (L) 02/19/2019   HCT 37.3 02/19/2019   MCV 91.4 02/19/2019   MCH 28.7 02/19/2019   RDW 13.2 02/19/2019   PLT 206 02/19/2019   Last metabolic panel Lab Results  Component Value Date   GLUCOSE 85 06/19/2024   NA 142 06/19/2024   K 4.2 06/19/2024   CL 103 06/19/2024   CO2 30 06/19/2024   BUN 13 06/19/2024   CREATININE 0.66 06/19/2024   GFR 97.65 06/19/2024   CALCIUM 9.5 06/19/2024   PROT 6.9 06/19/2024   ALBUMIN 4.2 06/19/2024   BILITOT 0.4 06/19/2024   ALKPHOS 92 06/19/2024   AST 11 06/19/2024   ALT 12 06/19/2024   ANIONGAP 7 02/19/2019   Lab Results  Component Value Date   CRP 1.2 06/19/2024   Lab Results  Component Value Date   ESRSEDRATE 32 (H) 06/19/2024   IMPRESSION AND PLAN:  #1 uncontrolled hypertension. Encouraged her to start the olmesartan -HCTZ 20-12.5, 1 tab daily. Recheck bmet at follow-up in 2 weeks.  #2 bilateral hands/fingers pain. She definitely has osteoarthritis. Her x-rays of her hands showed moderate to severe degenerative arthritis in the DIPs of both hands. No erosions.  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  An After Visit Summary was printed and given to the patient.  FOLLOW UP: Return in about 2 weeks (around 07/11/2024) for f/u HTN.  Signed:  Gerlene Hockey, MD           06/27/2024

## 2024-06-28 NOTE — Telephone Encounter (Signed)
 Millicent can you do this letter? thx

## 2024-07-12 ENCOUNTER — Other Ambulatory Visit: Payer: Self-pay | Admitting: Family Medicine

## 2024-07-17 ENCOUNTER — Ambulatory Visit: Admitting: Family Medicine

## 2024-07-17 ENCOUNTER — Encounter: Payer: Self-pay | Admitting: Family Medicine

## 2024-07-17 VITALS — BP 130/83 | HR 88 | Temp 98.2°F | Ht 66.0 in | Wt 332.2 lb

## 2024-07-17 DIAGNOSIS — I10 Essential (primary) hypertension: Secondary | ICD-10-CM

## 2024-07-17 DIAGNOSIS — E7800 Pure hypercholesterolemia, unspecified: Secondary | ICD-10-CM

## 2024-07-17 MED ORDER — LISINOPRIL 10 MG PO TABS: 10.0000 mg | ORAL_TABLET | Freq: Every day | ORAL | 11 refills | Status: DC

## 2024-07-17 MED ORDER — ROSUVASTATIN CALCIUM 10 MG PO TABS: ORAL_TABLET | ORAL | 3 refills | Status: AC

## 2024-07-17 NOTE — Progress Notes (Signed)
 OFFICE VISIT  07/17/2024  CC:  Chief Complaint  Patient presents with   Medical Management of Chronic Issues    2 week f/u; would like to discuss restarting Rosuvastatin ; stopped taking Benicar -HCT due to low BP and elevated HR     Patient is a 58 y.o. female who presents for 3-week follow-up uncontrolled hypertension. A/P as of last visit: #1 uncontrolled hypertension. Encouraged her to start the olmesartan -HCTZ 20-12.5, 1 tab daily. Recheck bmet at follow-up in 2 weeks.   #2 bilateral hands/fingers pain. She definitely has osteoarthritis. Her x-rays of her hands showed moderate to severe degenerative arthritis in the DIPs of both hands. No erosions.  INTERIM HX: She took olmesartan -HCTZ for 4 days and each day manage her blood pressure dropped into the 90s to 100s over the 50s.  Heart rate went up into the 90s. She did feel fatigued and funny. She stopped taking the medication and blood pressure came up into the 140s over 80s. She then restarted losartan 50 mg a day.  She feels normal now.  She has a history of hypercholesterolemia.  Last year her prior PCP prescribed her Crestor  10 mg a day. She says daily use caused pain in her legs.  She cut back to every other day use and felt no problem.  She ran out and did not request refill. Looking back, LDL was in the 190s when she started it last year.  She has never been on any other statin.  Review of systems: No dizziness, no shortness of breath, no chest pain, no headaches, no vision abnormalities, no rash, no cough.  Past Medical History:  Diagnosis Date   Arthritis    Basal cell carcinoma 04/2024   Hand numbness    occ   Heart palpitations    History of colon polyps    Hypercholesterolemia    Hypertension    OSA on CPAP    severe obstructive sleep apnea with an AHI of 84.6/h  now on auto CPAP from 5-13cm H2O   Urinary incontinence     Past Surgical History:  Procedure Laterality Date   ABDOMINAL HYSTERECTOMY  Bilateral 05/03/2016   Procedure: HYSTERECTOMY ABDOMINAL;  Surgeon: LELON Tanda Mulch, MD;  Location: [facility];  Service: Gynecology;  Laterality: Bilateral;  bilateral salpingoopherectomy    BILATERAL SALPINGECTOMY  05/03/2016   Procedure: BILATERAL SALPINGECTOMY;  Surgeon: LELON Tanda Mulch, MD;  Location: [facility];  Service: Gynecology;;   BREAST BIOPSY Right 09/20/2022   US  RT BREAST BX W LOC DEV 1ST LESION IMG BX SPEC US  GUIDE 09/20/2022 [facility]   BREAST BIOPSY Right 03/12/2024   US  RT BREAST BX W LOC DEV 1ST LESION IMG BX SPEC US  GUIDE 03/12/2024 [facility]   CESAREAN SECTION  11/10/86 09/06/88   x2   CHOLECYSTECTOMY  11/06/2012   Procedure: LAPAROSCOPIC CHOLECYSTECTOMY WITH INTRAOPERATIVE CHOLANGIOGRAM;  Surgeon: Donnice POUR. Belinda, MD;  Location: [facility];  Service: General;  Laterality: N/A;   COLONOSCOPY  2020   DILATION AND CURETTAGE OF UTERUS     DOBUTAMINE STRESS ECHO     2013 normal   EYE SURGERY     childhood   OOPHORECTOMY  05/03/2016   Procedure: BILATERAL OOPHORECTOMY;  Surgeon: LELON Tanda Mulch, MD;  Location: [facility];  Service: Gynecology;;   Sleep study     2024   TRANSTHORACIC ECHOCARDIOGRAM     01/20/23 NORMAL    Outpatient Medications Prior to Visit  Medication Sig Dispense Refill   aspirin EC  81 MG tablet Take 81 mg by mouth daily. Reported on 03/16/2016     Cetirizine HCl (ZYRTEC ALLERGY PO) Take 10 mg by mouth as needed.     Cholecalciferol (VITAMIN D PO) Take 2,000 Units by mouth daily. Reported on 03/16/2016     fluticasone (FLONASE ALLERGY RELIEF) 50 MCG/ACT nasal spray Place 1 spray into both nostrils daily. Reported on 03/16/2016     methocarbamol (ROBAXIN) 500 MG tablet Take 1,000 mg by mouth 4 (four) times daily.     loratadine (CLARITIN) 10 MG tablet Take 10 mg by mouth daily. Reported on 03/16/2016 (Patient not taking: Reported on 07/17/2024)     olmesartan -hydrochlorothiazide (BENICAR  HCT) 20-12.5 MG tablet TAKE 1 TABLET BY MOUTH EVERY  DAY (Patient not taking: Reported on 07/17/2024) 90 tablet 0   rosuvastatin  (CRESTOR ) 10 MG tablet Take 10 mg by mouth at bedtime. (Patient not taking: Reported on 07/17/2024)     No facility-administered medications prior to visit.    No Known Allergies  Review of Systems As per HPI  PE:    07/17/2024    3:58 PM 06/27/2024    8:52 AM 06/19/2024   10:37 AM  Vitals with BMI  Height 5' 6 5' 6 5' 6  Weight 332 lbs 3 oz 328 lbs 6 oz 331 lbs 6 oz  BMI 53.64 53.03 53.51  Systolic 130 134 855  Diastolic 83 81 85  Pulse 88 71 74     Physical Exam  Gen: Alert, well appearing.  Patient is oriented to person, place, time, and situation. AFFECT: pleasant, lucid thought and speech. No further exam today  LABS:  Last metabolic panel Lab Results  Component Value Date   GLUCOSE 85 06/19/2024   NA 142 06/19/2024   K 4.2 06/19/2024   CL 103 06/19/2024   CO2 30 06/19/2024   BUN 13 06/19/2024   CREATININE 0.66 06/19/2024   GFR 97.65 06/19/2024   CALCIUM  9.5 06/19/2024   PROT 6.9 06/19/2024   ALBUMIN 4.2 06/19/2024   BILITOT 0.4 06/19/2024   ALKPHOS 92 06/19/2024   AST 11 06/19/2024   ALT 12 06/19/2024   ANIONGAP 7 02/19/2019   IMPRESSION AND PLAN:  #1 hypertension, well-controlled on losartan 50 mg a day. She recalls being on lisinopril  10 mg in the past and thought it made her cough.  She then developed an acute respiratory illness and in hindsight thinks likely the lisinopril  was not the cause of her cough. She wants to get back on this medication--> stop losartan.  I sent in prescription for lisinopril  10 mg to take once daily.  2.  Hypercholesterolemia. LDL at prior PCP on 09/16/2022 was 194. She tolerated Crestor  10 mg every other day.  Has been off of this for quite a while.  Will restart at 10 mg every other day and recheck lipids when I see her back in 3 months.  An After Visit Summary was printed and given to the patient.  FOLLOW UP: Return in about 3 months  (around 10/17/2024) for annual CPE (fasting).  Signed:  Gerlene Hockey, MD           07/17/2024

## 2024-07-25 ENCOUNTER — Ambulatory Visit: Admitting: Family Medicine

## 2024-08-19 ENCOUNTER — Ambulatory Visit: Payer: Self-pay

## 2024-08-19 NOTE — Telephone Encounter (Signed)
 FYI Only or Action Required?: FYI only for provider.  Patient was last seen in primary care on 07/17/2024 by McGowen, Aleene DEL, MD.  Called Nurse Triage reporting Chest Pain.  Symptoms began several days ago.  Interventions attempted: Nothing.  Symptoms are: stable.  Triage Disposition: See PCP When Office is Open (Within 3 Days)  Patient/caregiver understands and will follow disposition?: Yes Reason for Disposition  [1] Chest pain(s) lasting a few seconds AND [2] persists > 3 days  Answer Assessment - Initial Assessment Questions States prior to Friday before the soreness / pulling feeling with yawning, chest felt sore prior.  1. LOCATION: Where does it hurt?       Hurts on left side of chest, right above breast and towards left shoulder  2. RADIATION: Does the pain go anywhere else? (e.g., into neck, jaw, arms, back)     No  3. ONSET: When did the chest pain begin? (Minutes, hours or days)      Friday  4. PATTERN: Does the pain come and go, or has it been constant since it started?  Does it get worse with exertion?      Feels discomfort, but notices it more when yawning, but not when opening mouth or eating   5. CARDIAC RISK FACTORS: Do you have any history of heart problems or risk factors for heart disease? (e.g., angina, prior heart attack; diabetes, high blood pressure, high cholesterol, smoker, or strong family history of heart disease)     High BP, High cholesterol, overweight   6. PULMONARY RISK FACTORS: Do you have any history of lung disease?  (e.g., blood clots in lung, asthma, emphysema, birth control pills)     Sleep apnea   7. CAUSE: What do you think is causing the chest pain?     Unsure, denies any injures  8. OTHER SYMPTOMS: Do you have any other symptoms? (e.g., dizziness, nausea, vomiting, sweating, fever, difficulty breathing, cough)       No. States felt a little nauseated 1-2 hours ago  Protocols used: Chest Pain-A-AH  Copied  from CRM [id number]. Topic: Clinical - Red Word Triage >> Aug 19, 2024  4:49 PM Chiquita SQUIBB wrote: Red Word that prompted transfer to Nurse Triage: Patient is calling in stating that when she yawns the left side of chest hurts.

## 2024-08-20 NOTE — Telephone Encounter (Signed)
 Noted pt scheduled for 10/01

## 2024-08-21 ENCOUNTER — Ambulatory Visit: Admitting: Family Medicine

## 2024-08-21 ENCOUNTER — Encounter: Payer: Self-pay | Admitting: Family Medicine

## 2024-08-21 VITALS — BP 143/71 | HR 83 | Temp 98.0°F | Ht 66.0 in | Wt 331.6 lb

## 2024-08-21 DIAGNOSIS — R0789 Other chest pain: Secondary | ICD-10-CM | POA: Diagnosis not present

## 2024-08-21 NOTE — Progress Notes (Signed)
 OFFICE VISIT  08/21/2024  CC:  Chief Complaint  Patient presents with   Chest Pain    Hurts on left side of chest, right above breast and towards left shoulder;  Feels discomfort, but notices it more when yawning, but not when opening mouth or eating. Describes as pulling or tugging ; denies sharp pain   Patient is a 58 y.o. female who presents for left chest soreness.  HPI: She noticed an intermittent feeling of soreness in the left upper chest wall about 6 to 7 days ago.  It comes and goes.  Mild intensity at the most.  She noticed when she yawned it felt like a pulling sensation in that area.  Hurts a little bit to lay on it.  Walking, eating, and deep breathing does not elicit it.  She has not been coughing.  No fever or malaise.  No associated nausea, shortness of breath, palpitations, or diaphoresis.   Past Medical History:  Diagnosis Date   Arthritis    Basal cell carcinoma 04/2024   Hand numbness    occ   Heart palpitations    History of colon polyps    Hypercholesterolemia    Hypertension    OSA on CPAP    severe obstructive sleep apnea with an AHI of 84.6/h  now on auto CPAP from 5-13cm H2O   Urinary incontinence     Past Surgical History:  Procedure Laterality Date   ABDOMINAL HYSTERECTOMY Bilateral 05/03/2016   Procedure: HYSTERECTOMY ABDOMINAL;  Surgeon: LELON Tanda Mulch, MD;  Location: [facility];  Service: Gynecology;  Laterality: Bilateral;  bilateral salpingoopherectomy    BILATERAL SALPINGECTOMY  05/03/2016   Procedure: BILATERAL SALPINGECTOMY;  Surgeon: LELON Tanda Mulch, MD;  Location: [facility];  Service: Gynecology;;   BREAST BIOPSY Right 09/20/2022   US  RT BREAST BX W LOC DEV 1ST LESION IMG BX SPEC US  GUIDE 09/20/2022 [facility]   BREAST BIOPSY Right 03/12/2024   US  RT BREAST BX W LOC DEV 1ST LESION IMG BX SPEC US  GUIDE 03/12/2024 [facility]   CESAREAN SECTION  11/10/86 09/06/88   x2   CHOLECYSTECTOMY  11/06/2012   Procedure: LAPAROSCOPIC  CHOLECYSTECTOMY WITH INTRAOPERATIVE CHOLANGIOGRAM;  Surgeon: Donnice POUR. Belinda, MD;  Location: [facility];  Service: General;  Laterality: N/A;   COLONOSCOPY  2020   DILATION AND CURETTAGE OF UTERUS     DOBUTAMINE STRESS ECHO     2013 normal   EYE SURGERY     childhood   OOPHORECTOMY  05/03/2016   Procedure: BILATERAL OOPHORECTOMY;  Surgeon: LELON Tanda Mulch, MD;  Location: [facility];  Service: Gynecology;;   Sleep study     2024   TRANSTHORACIC ECHOCARDIOGRAM     01/20/23 NORMAL    Outpatient Medications Prior to Visit  Medication Sig Dispense Refill   aspirin EC 81 MG tablet Take 81 mg by mouth daily. Reported on 03/16/2016     Cetirizine HCl (ZYRTEC ALLERGY PO) Take 10 mg by mouth as needed.     Cholecalciferol (VITAMIN D PO) Take 2,000 Units by mouth daily. Reported on 03/16/2016     fluticasone (FLONASE ALLERGY RELIEF) 50 MCG/ACT nasal spray Place 1 spray into both nostrils daily. Reported on 03/16/2016     lisinopril  (ZESTRIL ) 10 MG tablet Take 1 tablet (10 mg total) by mouth daily. 30 tablet 11   methocarbamol (ROBAXIN) 500 MG tablet Take 1,000 mg by mouth 4 (four) times daily.     rosuvastatin  (CRESTOR ) 10 MG tablet 1 tab  po every other day 45 tablet 3   loratadine (CLARITIN) 10 MG tablet Take 10 mg by mouth daily. Reported on 03/16/2016 (Patient not taking: Reported on 08/21/2024)     No facility-administered medications prior to visit.    No Known Allergies  Review of Systems  As per HPI  PE:    08/21/2024    4:00 PM 07/17/2024    3:58 PM 06/27/2024    8:52 AM  Vitals with BMI  Height 5' 6 5' 6 5' 6  Weight 331 lbs 10 oz 332 lbs 3 oz 328 lbs 6 oz  BMI 53.55 53.64 53.03  Systolic 143 130 865  Diastolic 71 83 81  Pulse 83 88 71     Physical Exam Exam chaperoned by Bobbetta Degree, CMA.  General: Alert and well-appearing. Cardiovascular: Regular rhythm and rate without murmur or rub. Lungs are clear to auscultation bilaterally, breathing is  nonlabored. No chest wall tenderness to palpation.  LABS:    IMPRESSION AND PLAN:  Chest wall pain. Reassured. Observe. Signs/symptoms to call or return for were reviewed and pt expressed understanding.  An After Visit Summary was printed and given to the patient.  FOLLOW UP: No follow-ups on file.  Signed:  Gerlene Hockey, MD           08/21/2024

## 2024-10-14 ENCOUNTER — Ambulatory Visit: Admitting: Family Medicine

## 2024-10-14 ENCOUNTER — Encounter: Payer: Self-pay | Admitting: Family Medicine

## 2024-10-14 ENCOUNTER — Ambulatory Visit (HOSPITAL_BASED_OUTPATIENT_CLINIC_OR_DEPARTMENT_OTHER)
Admission: RE | Admit: 2024-10-14 | Discharge: 2024-10-14 | Disposition: A | Discharge status: Home / Self Care | Source: Ambulatory Visit | Attending: Family Medicine | Admitting: Family Medicine

## 2024-10-14 VITALS — BP 140/90 | HR 86 | Temp 98.1°F | Wt 329.0 lb

## 2024-10-14 DIAGNOSIS — M25551 Pain in right hip: Secondary | ICD-10-CM | POA: Insufficient documentation

## 2024-10-14 MED ORDER — DICLOFENAC SODIUM 75 MG PO TBEC: 75.0000 mg | DELAYED_RELEASE_TABLET | Freq: Two times a day (BID) | ORAL | 0 refills | Status: DC

## 2024-10-14 MED ORDER — METHOCARBAMOL 500 MG PO TABS: 500.0000 mg | ORAL_TABLET | Freq: Three times a day (TID) | ORAL | 0 refills | Status: AC | PRN

## 2024-10-14 NOTE — Patient Instructions (Addendum)
         Great to see you today.  I have refilled the medication(s) we provide.   If labs were collected or images ordered, we will inform you of  results once we have received them and reviewed. We will contact you either by echart message, or telephone call.  Please give ample time to the testing facility, and our office to run,  receive and review results. Please do not call inquiring of results, even if you can see them in your chart. We will contact you as soon as we are able. If it has been over 1 week since the test was completed, and you have not yet heard from us , then please call us .    - echart message- for normal results that have been seen by the patient already.   - telephone call: abnormal results or if patient has not viewed results in their echart.  If a referral to a specialist was entered for you, please call us  in 2 weeks if you have not heard from the specialist office to schedule.

## 2024-10-14 NOTE — Progress Notes (Signed)
 Marissa Marks , [birth year], 58 y.o., female MRN: [id number] Patient Care Team    Relationship Specialty Notifications Start End  McGowen, Aleene DEL, MD PCP - General Family Medicine  08/24/23   Jeffrie Oneil BROCKS, MD Consulting Physician Cardiology  08/24/23   Shlomo Wilbert SAUNDERS, MD Consulting Physician Cardiology  08/24/23   Octavia Bruckner, MD Consulting Physician Ophthalmology  08/24/23   Dannielle Bouchard, DO Consulting Physician Obstetrics and Gynecology  08/24/23     Chief Complaint  Patient presents with   Hip Pain    Since 11/13. R sided. Pt has tried Tylenol .      Subjective: Marissa Marks is a 58 y.o. Pt presents for an OV with complaints of right hip pain of 11d duration.  Patient reports she was walking down the hallway at school when a sharp pain occurred in the front of her hip area.  She reports there was no prior injury.  She has never had similar symptoms in the past at this location.  She denies any change in activity that she can contribute the onset of pain to them. Pt has tried tylenol  to ease their symptoms.  She states she tried to rest the area, has been sitting in recliner.  She states when she is sitting or laying down there is no pain.  As soon as she steps and applies weightbearing, pain occurs.  She does endorse pain with steps. She states the pain first started in the front of her hip/upper thigh area, but then she has had discomfort on the lateral side of her hip her right SI joint and just below the ASIS on the right.      06/19/2024   10:51 AM 08/25/2023    7:53 AM  Depression screen PHQ 2/9  Decreased Interest 0 0  Down, Depressed, Hopeless 0 0  PHQ - 2 Score 0 0    No Known Allergies Social History   Social History Narrative   Married, 2 sons, 3 GC.   Educ: HS, 1 yr college   Occup: Architectural technologist at [employer]   Her mother has academic librarian and lives with her.   No tob or alc   Past Medical History:  Diagnosis Date   Arthritis    Basal cell  carcinoma 04/2024   Hand numbness    occ   Heart palpitations    History of colon polyps    Hypercholesterolemia    Hypertension    OSA on CPAP    severe obstructive sleep apnea with an AHI of 84.6/h  now on auto CPAP from 5-13cm H2O   Urinary incontinence    Past Surgical History:  Procedure Laterality Date   ABDOMINAL HYSTERECTOMY Bilateral 05/03/2016   Procedure: HYSTERECTOMY ABDOMINAL;  Surgeon: LELON Tanda Mulch, MD;  Location: [facility];  Service: Gynecology;  Laterality: Bilateral;  bilateral salpingoopherectomy    BILATERAL SALPINGECTOMY  05/03/2016   Procedure: BILATERAL SALPINGECTOMY;  Surgeon: LELON Tanda Mulch, MD;  Location: [facility];  Service: Gynecology;;   BREAST BIOPSY Right 09/20/2022   US  RT BREAST BX W LOC DEV 1ST LESION IMG BX SPEC US  GUIDE 09/20/2022 [facility]   BREAST BIOPSY Right 03/12/2024   US  RT BREAST BX W LOC DEV 1ST LESION IMG BX SPEC US  GUIDE 03/12/2024 [facility]   CESAREAN SECTION  11/10/86 09/06/88   x2   CHOLECYSTECTOMY  11/06/2012   Procedure: LAPAROSCOPIC CHOLECYSTECTOMY WITH INTRAOPERATIVE CHOLANGIOGRAM;  Surgeon: Donnice POUR. Belinda, MD;  [facility];  Service: General;  Laterality: N/A;   COLONOSCOPY  2020   DILATION AND CURETTAGE OF UTERUS     DOBUTAMINE STRESS ECHO     2013 normal   EYE SURGERY     childhood   OOPHORECTOMY  05/03/2016   Procedure: BILATERAL OOPHORECTOMY;  Surgeon: LELON Tanda Mulch, MD;  Location: [facility];  Service: Gynecology;;   Sleep study     2024   TRANSTHORACIC ECHOCARDIOGRAM     01/20/23 NORMAL   Family History  Problem Relation Age of Onset   Parkinson's disease Mother    Arthritis Mother    Heart disease Father    Arthritis Father    Diabetes Father    Heart attack Father    High Cholesterol Father    High blood pressure Father    Cancer Brother    Early death Brother    Heart disease Maternal Grandmother    Arthritis Maternal Grandmother    Heart attack Maternal Grandmother     Heart disease Maternal Grandfather    Early death Maternal Grandfather    Heart attack Maternal Grandfather    Mental illness Maternal Grandfather    Heart disease Paternal Grandmother    Ovarian cancer Paternal Grandmother    Arthritis Paternal Grandmother    Hearing loss Paternal Grandmother    Heart attack Paternal Grandmother    High blood pressure Paternal Grandmother    Mental illness Paternal Grandmother    Heart disease Paternal Grandfather    Alcohol abuse Paternal Grandfather    Early death Paternal Grandfather    Heart attack Paternal Grandfather    High Cholesterol Paternal Grandfather    Allergies as of 10/14/2024   No Known Allergies      Medication List        Accurate as of October 14, 2024  8:50 AM. If you have any questions, ask your nurse or doctor.          STOP taking these medications    loratadine 10 MG tablet Commonly known as: CLARITIN Stopped by: Charlies Bellini       TAKE these medications    aspirin EC 81 MG tablet Take 81 mg by mouth daily. Reported on 03/16/2016   diclofenac  75 MG EC tablet Commonly known as: VOLTAREN  Take 1 tablet (75 mg total) by mouth 2 (two) times daily. Started by: Lorrane Mccay   Flonase Allergy Relief 50 MCG/ACT nasal spray Generic drug: fluticasone Place 1 spray into both nostrils daily. Reported on 03/16/2016   lisinopril  10 MG tablet Commonly known as: ZESTRIL  Take 1 tablet (10 mg total) by mouth daily.   methocarbamol  500 MG tablet Commonly known as: ROBAXIN  Take 1 tablet (500 mg total) by mouth every 8 (eight) hours as needed for muscle spasms. What changed:  how much to take when to take this reasons to take this Changed by: Charlies Bellini   rosuvastatin  10 MG tablet Commonly known as: CRESTOR  1 tab po every other day   VITAMIN D PO Take 2,000 Units by mouth daily. Reported on 03/16/2016   ZYRTEC ALLERGY PO Take 10 mg by mouth as needed.        All past medical history, surgical  history, allergies, family history, immunizations andmedications were updated in the EMR today and reviewed under the history and medication portions of their EMR.     ROS Negative, with the exception of above mentioned in HPI   Objective:  BP (!) 140/90   Pulse  86   Temp 98.1 F (36.7 C)   Wt (!) 329 lb (149.2 kg)   LMP 04/14/2016 (Exact Date) Comment: still bleeding  SpO2 97%   BMI 53.10 kg/m  Body mass index is 53.1 kg/m.  Physical Exam Vitals and nursing note reviewed.  Constitutional:      General: She is not in acute distress.    Appearance: Normal appearance. She is obese. She is not ill-appearing or toxic-appearing.  HENT:     Head: Normocephalic and atraumatic.  Eyes:     General: No scleral icterus.       Right eye: No discharge.        Left eye: No discharge.     Extraocular Movements: Extraocular movements intact.     Conjunctiva/sclera: Conjunctivae normal.     Pupils: Pupils are equal, round, and reactive to light.  Musculoskeletal:     Right hip: Tenderness present. No bony tenderness. Decreased range of motion. Normal strength.     Left hip: Normal strength.       Legs:     Comments: RLE: Mild tenderness to palpation right hip flexor, lateral hip bursa.  Mild discomfort with hip flexion.  Discomfort with internal rotation of hip.  Neurologically intact distally  Skin:    Findings: No rash.  Neurological:     Mental Status: She is alert and oriented to person, place, and time. Mental status is at baseline.     Motor: No weakness.     Coordination: Coordination normal.     Gait: Gait normal.  Psychiatric:        Mood and Affect: Mood normal.        Behavior: Behavior normal.        Thought Content: Thought content normal.        Judgment: Judgment normal.      No results found. No results found. No results found for this or any previous visit (from the past 24 hours).  Assessment/Plan: Marissa Marks is a 58 y.o. female present for OV for   Right hip pain Suspect either right OA flareup for hip flexor cause of pain.  The secondary location of discomfort are likely due to her compensating for pain with walking.   -Prescribed Robaxin -as tolerated in the past and diclofenac  twice daily with food 2-4 weeks - Right hip x-ray ordered today -Follow-up depending upon x-ray results.  She does have an upcoming appointment with the provider scheduled.               Discussed self-management of symptoms. Outlined signs and symptoms indicating need for more acute intervention. Patient verbalized understanding and all questions were answered. Patient received an After-Visit Summary.    Orders Placed This Encounter  Procedures   DG Hip Unilat W OR W/O Pelvis 2-3 Views Right   Meds ordered this encounter  Medications   methocarbamol  (ROBAXIN ) 500 MG tablet    Sig: Take 1 tablet (500 mg total) by mouth every 8 (eight) hours as needed for muscle spasms.    Dispense:  90 tablet    Refill:  0   diclofenac  (VOLTAREN ) 75 MG EC tablet    Sig: Take 1 tablet (75 mg total) by mouth 2 (two) times daily.    Dispense:  30 tablet    Refill:  0   Referral Orders  No referral(s) requested today     Note is dictated utilizing voice recognition software. Although note has been proof read prior to signing, occasional typographical  errors still can be missed. If any questions arise, please do not hesitate to call for verification.   electronically signed by:  Charlies Bellini, DO  [facility] - [place]

## 2024-10-21 ENCOUNTER — Ambulatory Visit: Payer: Self-pay | Admitting: Family Medicine

## 2024-10-21 NOTE — Telephone Encounter (Signed)
 Please call patient Marissa Marks hip x-ray is consistent with osteoarthritis of bilateral hips.  Marissa Marks right hip does appear to have a little bit more arthritis than the left side. Recommend she take the diclofenac  and the Robaxin  as prescribed. The discomfort certainly could be coming from the hip joint and the arthritis, she possibly could have had a mild flare of Marissa Marks arthritis in Marissa Marks hip.  Could also be secondary to muscle strain as we discussed during Marissa Marks visit.  The treatment for now would be the same. If she continues to have increased discomfort in Marissa Marks right hip, would presume the arthritis is the more predominant cause and she would likely need referral to orthopedics.  Hopefully she sees full resolution with treatment plan within 2 weeks.  Follow-up with Marissa Marks PCP in 3-4 weeks if symptoms are not proving, sooner if worsening.

## 2024-11-08 ENCOUNTER — Telehealth: Payer: Self-pay

## 2024-11-08 DIAGNOSIS — I10 Essential (primary) hypertension: Secondary | ICD-10-CM

## 2024-11-08 DIAGNOSIS — E7800 Pure hypercholesterolemia, unspecified: Secondary | ICD-10-CM

## 2024-11-08 NOTE — Telephone Encounter (Signed)
 Reason for CRM: patient had to reschedule her appointment for her physical from 12/23 to 1/21. She mentioned needing fasting bloodwork done but I didn't see any labs in her chart. I let her know I would send message to physician if fasting bloodwork is needed so she can call and schedule that on another day early in the morning or schedule after physical per physician preference   Please advise if ok for early labs

## 2024-11-08 NOTE — Telephone Encounter (Signed)
 Yes, please order future CBC with differential, c-Met, and lipid panel.  Diagnosis hypercholesterolemia and essential hypertension.

## 2024-11-11 NOTE — Telephone Encounter (Signed)
 No further action needed at this time.

## 2024-11-11 NOTE — Addendum Note (Signed)
 Addended by: FLETA CARE D on: 11/11/2024 08:37 AM   Modules accepted: Orders

## 2024-11-12 ENCOUNTER — Encounter: Admitting: Family Medicine

## 2024-11-19 ENCOUNTER — Ambulatory Visit: Admitting: Nurse Practitioner

## 2024-11-19 ENCOUNTER — Encounter: Payer: Self-pay | Admitting: Nurse Practitioner

## 2024-11-19 VITALS — BP 140/82 | HR 72 | Temp 97.8°F | Ht 66.0 in | Wt 328.0 lb

## 2024-11-19 DIAGNOSIS — E66813 Obesity, class 3: Secondary | ICD-10-CM | POA: Diagnosis not present

## 2024-11-19 DIAGNOSIS — E7800 Pure hypercholesterolemia, unspecified: Secondary | ICD-10-CM | POA: Diagnosis not present

## 2024-11-19 DIAGNOSIS — Z6841 Body Mass Index (BMI) 40.0 and over, adult: Secondary | ICD-10-CM

## 2024-11-19 DIAGNOSIS — I10 Essential (primary) hypertension: Secondary | ICD-10-CM

## 2024-11-19 DIAGNOSIS — G4733 Obstructive sleep apnea (adult) (pediatric): Secondary | ICD-10-CM

## 2024-11-19 NOTE — Progress Notes (Deleted)
 " Office: [phone]  /  Fax: [phone]   Initial Visit  Sarely P Rossi was seen in clinic today to evaluate for obesity. She is interested in losing weight to improve overall health and reduce the risk of weight related complications. She presents today to review program treatment options, initial physical assessment, and evaluation.     She was referred by: {emreferby:28303}  When asked what else they would like to accomplish? She states: {EMHopetoaccomplish:28304::Adopt a healthier eating pattern and lifestyle,Improve energy levels and physical activity,Improve existing medical conditions,Improve quality of life}  Weight history:  When asked how has your weight affected you? She states: {EMWeightAffected:28305}  Some associated conditions: {EMSomeConditions:28306}  Contributing factors: {EMcontributingfactors:28307}  Weight promoting medications identified: {EMWeightpromotingrx:28308}  Current nutrition plan: {EMNutritionplan:28309::None}  Current level of physical activity: {EMcurrentPA:28310::None}  Current or previous pharmacotherapy: {EM previousRx:28311}  Response to medication: {EMResponsetomedication:28312}   Past medical history includes:   Past Medical History:  Diagnosis Date   Arthritis    Basal cell carcinoma 04/2024   Hand numbness    occ   Heart palpitations    History of colon polyps    Hypercholesterolemia    Hypertension    OSA on CPAP    severe obstructive sleep apnea with an AHI of 84.6/h  now on auto CPAP from 5-13cm H2O   Urinary incontinence      Objective:   BP (!) 163/93   Pulse 72   Temp 97.8 F (36.6 C)   Ht 5' 6 (1.676 m)   Wt (!) 328 lb (148.8 kg)   LMP 04/14/2016 Comment: still bleeding  SpO2 95%   BMI 52.94 kg/m  She was weighed on the bioimpedance scale: Body mass index is 52.94 kg/m.  Peak Weight:*** , Body Fat%:***, Visceral Fat Rating:***, Weight trend over the last 12 months:  {emweighttrend:28333}  General:  Alert, oriented and cooperative. Patient is in no acute distress.  Respiratory: Normal respiratory effort, no problems with respiration noted   Gait: able to ambulate independently  Mental Status: Normal mood and affect. Normal behavior. Normal judgment and thought content.   DIAGNOSTIC DATA REVIEWED:  BMET    Component Value Date/Time   NA 142 06/19/2024 1115   K 4.2 06/19/2024 1115   CL 103 06/19/2024 1115   CO2 30 06/19/2024 1115   GLUCOSE 85 06/19/2024 1115   BUN 13 06/19/2024 1115   CREATININE 0.66 06/19/2024 1115   CALCIUM  9.5 06/19/2024 1115   GFRNONAA >60 02/19/2019 0137   GFRAA >60 02/19/2019 0137   No results found for: HGBA1C No results found for: INSULIN CBC    Component Value Date/Time   WBC 6.0 02/19/2019 0137   RBC 4.08 02/19/2019 0137   HGB 11.7 (L) 02/19/2019 0137   HCT 37.3 02/19/2019 0137   PLT 206 02/19/2019 0137   MCV 91.4 02/19/2019 0137   MCH 28.7 02/19/2019 0137   MCHC 31.4 02/19/2019 0137   RDW 13.2 02/19/2019 0137   Iron/TIBC/Ferritin/ %Sat No results found for: IRON, TIBC, FERRITIN, IRONPCTSAT Lipid Panel  No results found for: CHOL, TRIG, HDL, CHOLHDL, VLDL, LDLCALC, LDLDIRECT Hepatic Function Panel     Component Value Date/Time   PROT 6.9 06/19/2024 1115   ALBUMIN 4.2 06/19/2024 1115   AST 11 06/19/2024 1115   ALT 12 06/19/2024 1115   ALKPHOS 92 06/19/2024 1115   BILITOT 0.4 06/19/2024 1115   No results found for: TSH   Assessment and Plan:   Essential hypertension  Hypercholesterolemia  OSA on CPAP  Obesity, Class III, BMI 40-49.9 (morbid obesity) (HCC)        Obesity Treatment / Action Plan:  {EMobesityactionplanscribe:28314::Patient will work on garnering support from family and friends to begin weight loss journey.,Will work on eliminating or reducing the presence of highly palatable, calorie dense foods in the home.,Will complete provided  nutritional and psychosocial assessment questionnaire before the next appointment.,Will be scheduled for indirect calorimetry to determine resting energy expenditure in a fasting state.  This will allow us  to create a reduced calorie, high-protein meal plan to promote loss of fat mass while preserving muscle mass.,Counseled on the health benefits of losing 5%-15% of total body weight.,Was counseled on nutritional approaches to weight loss and benefits of reducing processed foods and consuming plant-based foods and high quality protein as part of nutritional weight management.,Was counseled on pharmacotherapy and role as an adjunct in weight management. }  Obesity Education Performed Today:  She was weighed on the bioimpedance scale and results were discussed and documented in the synopsis.  We discussed obesity as a disease and the importance of a more detailed evaluation of all the factors contributing to the disease.  We discussed the importance of [place] term lifestyle changes which include nutrition, exercise and behavioral modifications as well as the importance of customizing this to her specific health and social needs.  We discussed the benefits of reaching a healthier weight to alleviate the symptoms of existing conditions and reduce the risks of the biomechanical, metabolic and psychological effects of obesity.  Vaudie P Muzquiz appears to be in the action stage of change and states they are ready to start intensive lifestyle modifications and behavioral modifications.  *** minutes was spent today on this visit including the above counseling, pre-visit chart review, and post-visit documentation.  Reviewed by clinician on day of visit: allergies, medications, problem list, medical history, surgical history, family history, social history, and previous encounter notes pertinent to obesity diagnosis.    Corean SAUNDERS Tickerhoff FNP-C   "

## 2024-11-19 NOTE — Progress Notes (Signed)
 " Office: [phone]  /  Fax: [phone]   Initial Visit  Marissa Marks was seen in clinic today to evaluate for obesity. She is interested in losing weight to improve overall health and reduce the risk of weight related complications. She presents today to review program treatment options, initial physical assessment, and evaluation.     She was referred by: Friend or Family  When asked what else they would like to accomplish? She states: Adopt a healthier eating pattern and lifestyle, Improve energy levels and physical activity, Improve existing medical conditions, Reduce number of medications, and Improve quality of life  When asked how has your weight affected you? She states: Contributed to medical problems, Contributed to orthopedic problems or mobility issues, Having fatigue, and Having poor endurance  Some associated conditions: HTN, OSAS on CPAP (severe with AHI of 84.6), chronic cholecystitis with calculus, palpitations, HLD  Contributing factors: family history of obesity, consumption of processed foods, moderate to high levels of stress, reduced physical activity, menopause, multiple weight loss attempts in the past, sedentary job, and hectic pace of life  Weight promoting medications identified: None  Current nutrition plan: None  Current level of physical activity: None  Current or previous pharmacotherapy: took something in the past but in unsure of what she took  Response to medication: Unsure   Past medical history includes:   Past Medical History:  Diagnosis Date   Arthritis    Basal cell carcinoma 04/2024   Hand numbness    occ   Heart palpitations    History of colon polyps    Hypercholesterolemia    Hypertension    OSA on CPAP    severe obstructive sleep apnea with an AHI of 84.6/h  now on auto CPAP from 5-13cm H2O   Urinary incontinence      Objective:   LMP 04/14/2016 Comment: still bleeding She was weighed on the bioimpedance scale: There is  no height or weight on file to calculate BMI.  Peak Weight:332 lbs , Body Fat%:56.1%, Visceral Fat Rating:22, Weight trend over the last 12 months: Unchanged  General:  Alert, oriented and cooperative. Patient is in no acute distress.  Respiratory: Normal respiratory effort, no problems with respiration noted   Gait: able to ambulate independently  Mental Status: Normal mood and affect. Normal behavior. Normal judgment and thought content.   DIAGNOSTIC DATA REVIEWED:  BMET    Component Value Date/Time   NA 142 06/19/2024 1115   K 4.2 06/19/2024 1115   CL 103 06/19/2024 1115   CO2 30 06/19/2024 1115   GLUCOSE 85 06/19/2024 1115   BUN 13 06/19/2024 1115   CREATININE 0.66 06/19/2024 1115   CALCIUM  9.5 06/19/2024 1115   GFRNONAA >60 02/19/2019 0137   GFRAA >60 02/19/2019 0137   No results found for: HGBA1C No results found for: INSULIN CBC    Component Value Date/Time   WBC 6.0 02/19/2019 0137   RBC 4.08 02/19/2019 0137   HGB 11.7 (L) 02/19/2019 0137   HCT 37.3 02/19/2019 0137   PLT 206 02/19/2019 0137   MCV 91.4 02/19/2019 0137   MCH 28.7 02/19/2019 0137   MCHC 31.4 02/19/2019 0137   RDW 13.2 02/19/2019 0137   Iron/TIBC/Ferritin/ %Sat No results found for: IRON, TIBC, FERRITIN, IRONPCTSAT Lipid Panel  No results found for: CHOL, TRIG, HDL, CHOLHDL, VLDL, LDLCALC, LDLDIRECT Hepatic Function Panel     Component Value Date/Time   PROT 6.9 06/19/2024 1115   ALBUMIN 4.2 06/19/2024 1115   AST  11 06/19/2024 1115   ALT 12 06/19/2024 1115   ALKPHOS 92 06/19/2024 1115   BILITOT 0.4 06/19/2024 1115   No results found for: TSH   Assessment and Plan:   Essential hypertension Continue to follow up with PCP.  Take med as directed  Hypercholesterolemia Continue to follow up with PCP.  Take med as directed  OSA on CPAP Discussed the importance of using CPAP on a nightly basis and the risk associated with untreated sleep apnea such as but not  limited to hypertension, MI and CVA.  Obesity, Class III, BMI 40-49.9 (morbid obesity) (HCC)        Obesity Treatment / Action Plan:  Patient will work on garnering support from family and friends to begin weight loss journey. Will work on eliminating or reducing the presence of highly palatable, calorie dense foods in the home. Will complete provided nutritional and psychosocial assessment questionnaire before the next appointment. Will be scheduled for indirect calorimetry to determine resting energy expenditure in a fasting state.  This will allow us  to create a reduced calorie, high-protein meal plan to promote loss of fat mass while preserving muscle mass. Counseled on the health benefits of losing 5%-15% of total body weight. Was counseled on nutritional approaches to weight loss and benefits of reducing processed foods and consuming plant-based foods and high quality protein as part of nutritional weight management. Was counseled on pharmacotherapy and role as an adjunct in weight management.   Obesity Education Performed Today:  She was weighed on the bioimpedance scale and results were discussed and documented in the synopsis.  We discussed obesity as a disease and the importance of a more detailed evaluation of all the factors contributing to the disease.  We discussed the importance of Marissa Marks term lifestyle changes which include nutrition, exercise and behavioral modifications as well as the importance of customizing this to her specific health and social needs.  We discussed the benefits of reaching a healthier weight to alleviate the symptoms of existing conditions and reduce the risks of the biomechanical, metabolic and psychological effects of obesity.  Marissa Marks appears to be in the action stage of change and states they are ready to start intensive lifestyle modifications and behavioral modifications.  55 minutes was spent today on this visit including the above  counseling, pre-visit chart review, and post-visit documentation.  Reviewed by clinician on day of visit: allergies, medications, problem list, medical history, surgical history, family history, social history, and previous encounter notes pertinent to obesity diagnosis.    Corean SAUNDERS Modell Fendrick FNP-C   "

## 2024-12-11 ENCOUNTER — Encounter: Admitting: Family Medicine

## 2024-12-19 ENCOUNTER — Ambulatory Visit: Admitting: Bariatrics

## 2024-12-27 ENCOUNTER — Ambulatory Visit: Admitting: Family Medicine

## 2024-12-27 ENCOUNTER — Encounter: Payer: Self-pay | Admitting: Family Medicine

## 2024-12-27 VITALS — Ht 66.0 in | Wt 330.4 lb

## 2024-12-27 DIAGNOSIS — Z0000 Encounter for general adult medical examination without abnormal findings: Secondary | ICD-10-CM

## 2024-12-27 DIAGNOSIS — I10 Essential (primary) hypertension: Secondary | ICD-10-CM

## 2024-12-27 DIAGNOSIS — E7800 Pure hypercholesterolemia, unspecified: Secondary | ICD-10-CM

## 2024-12-27 MED ORDER — FLUTICASONE PROPIONATE 50 MCG/ACT NA SUSP: 1.0000 | Freq: Every day | NASAL | 3 refills | Status: AC

## 2024-12-27 MED ORDER — LISINOPRIL 10 MG PO TABS: 10.0000 mg | ORAL_TABLET | Freq: Every day | ORAL | 3 refills | Status: AC

## 2024-12-27 NOTE — Patient Instructions (Signed)
 Health Maintenance, Female Adopting a healthy lifestyle and getting preventive care are important in promoting health and wellness. Ask your health care provider about: The right schedule for you to have regular tests and exams. Things you can do on your own to prevent diseases and keep yourself healthy. What should I know about diet, weight, and exercise? Eat a healthy diet  Eat a diet that includes plenty of vegetables, fruits, low-fat dairy products, and lean protein. Do not eat a lot of foods that are high in solid fats, added sugars, or sodium. Maintain a healthy weight Body mass index (BMI) is used to identify weight problems. It estimates body fat based on height and weight. Your health care provider can help determine your BMI and help you achieve or maintain a healthy weight. Get regular exercise Get regular exercise. This is one of the most important things you can do for your health. Most adults should: Exercise for at least 150 minutes each week. The exercise should increase your heart rate and make you sweat (moderate-intensity exercise). Do strengthening exercises at least twice a week. This is in addition to the moderate-intensity exercise. Spend less time sitting. Even light physical activity can be beneficial. Watch cholesterol and blood lipids Have your blood tested for lipids and cholesterol at 58 years of age, then have this test every 5 years. Have your cholesterol levels checked more often if: Your lipid or cholesterol levels are high. You are older than 58 years of age. You are at high risk for heart disease. What should I know about cancer screening? Depending on your health history and family history, you may need to have cancer screening at various ages. This may include screening for: Breast cancer. Cervical cancer. Colorectal cancer. Skin cancer. Lung cancer. What should I know about heart disease, diabetes, and high blood pressure? Blood pressure and heart  disease High blood pressure causes heart disease and increases the risk of stroke. This is more likely to develop in people who have high blood pressure readings or are overweight. Have your blood pressure checked: Every 3-5 years if you are 32-37 years of age. Every year if you are 71 years old or older. Diabetes Have regular diabetes screenings. This checks your fasting blood sugar level. Have the screening done: Once every three years after age 24 if you are at a normal weight and have a low risk for diabetes. More often and at a younger age if you are overweight or have a high risk for diabetes. What should I know about preventing infection? Hepatitis B If you have a higher risk for hepatitis B, you should be screened for this virus. Talk with your health care provider to find out if you are at risk for hepatitis B infection. Hepatitis C Testing is recommended for: Everyone born from [birth year] through 1964. Anyone with known risk factors for hepatitis C. Sexually transmitted infections (STIs) Get screened for STIs, including gonorrhea and chlamydia, if: You are sexually active and are younger than 58 years of age. You are older than 58 years of age and your health care provider tells you that you are at risk for this type of infection. Your sexual activity has changed since you were last screened, and you are at increased risk for chlamydia or gonorrhea. Ask your health care provider if you are at risk. Ask your health care provider about whether you are at high risk for HIV. Your health care provider may recommend a prescription medicine to help prevent HIV  infection. If you choose to take medicine to prevent HIV, you should first get tested for HIV. You should then be tested every 3 months for as long as you are taking the medicine. Pregnancy If you are about to stop having your period (premenopausal) and you may become pregnant, seek counseling before you get pregnant. Take 400 to 800  micrograms (mcg) of folic acid every day if you become pregnant. Ask for birth control (contraception) if you want to prevent pregnancy. Osteoporosis and menopause Osteoporosis is a disease in which the bones lose minerals and strength with aging. This can result in bone fractures. If you are 42 years old or older, or if you are at risk for osteoporosis and fractures, ask your health care provider if you should: Be screened for bone loss. Take a calcium  or vitamin D  supplement to lower your risk of fractures. Be given hormone replacement therapy (HRT) to treat symptoms of menopause. Follow these instructions at home: Alcohol use Do not drink alcohol if: Your health care provider tells you not to drink. You are pregnant, may be pregnant, or are planning to become pregnant. If you drink alcohol: Limit how much you have to: 0-1 drink a day. Know how much alcohol is in your drink. In the [place] one drink equals one 12 oz bottle of beer (355 mL), one 5 oz glass of wine (148 mL), or one 1 oz glass of hard liquor (44 mL). Lifestyle Do not use any products that contain nicotine or tobacco. These products include cigarettes, chewing tobacco, and vaping devices, such as e-cigarettes. If you need help quitting, ask your health care provider. Do not use street drugs. Do not share needles. Ask your health care provider for help if you need support or information about quitting drugs. General instructions Schedule regular health, dental, and eye exams. Stay current with your vaccines. Tell your health care provider if: You often feel depressed. You have ever been abused or do not feel safe at home. This information is not intended to replace advice given to you by your health care provider. Make sure you discuss any questions you have with your health care provider. Document Revised: 05/15/2024 Document Reviewed: 03/29/2021 Elsevier Patient Education  2025 [employer].

## 2024-12-27 NOTE — Progress Notes (Signed)
 "     Office Note 12/27/2024  CC:  Chief Complaint  Patient presents with   Annual Exam    Pt is not fasting; upcoming appt with [facility] on 2/24 with labs to be done. Mammogram scheduled for [place].     HPI:  Patient is a 59 y.o. female who is here for annual health maintenance exam and follow-up hypertension, hypercholesterolemia.  I started her on Crestor  10 mg every other day back in August 2025. So far so good with this.  She has no cough from her lisinopril .  She has established care with [facility].  Past Medical History:  Diagnosis Date   Arthritis    Basal cell carcinoma 04/2024   Hand numbness    occ   Heart palpitations    History of colon polyps    Hypercholesterolemia    Hypertension    OSA on CPAP    severe obstructive sleep apnea with an AHI of 84.6/h  now on auto CPAP from 5-13cm H2O   Urinary incontinence     Past Surgical History:  Procedure Laterality Date   ABDOMINAL HYSTERECTOMY Bilateral 05/03/2016   Procedure: HYSTERECTOMY ABDOMINAL;  Surgeon: LELON Tanda Mulch, MD;  Location: [facility];  Service: Gynecology;  Laterality: Bilateral;  bilateral salpingoopherectomy    BILATERAL SALPINGECTOMY  05/03/2016   Procedure: BILATERAL SALPINGECTOMY;  Surgeon: LELON Tanda Mulch, MD;  Location: [facility];  Service: Gynecology;;   BREAST BIOPSY Right 09/20/2022   US  RT BREAST BX W LOC DEV 1ST LESION IMG BX SPEC US  GUIDE 09/20/2022 [facility]   BREAST BIOPSY Right 03/12/2024   US  RT BREAST BX W LOC DEV 1ST LESION IMG BX SPEC US  GUIDE 03/12/2024 [facility]   CESAREAN SECTION  11/10/86 09/06/88   x2   CHOLECYSTECTOMY  11/06/2012   Procedure: LAPAROSCOPIC CHOLECYSTECTOMY WITH INTRAOPERATIVE CHOLANGIOGRAM;  Surgeon: Donnice POUR. Belinda, MD;  Location: [facility];  Service: General;  Laterality: N/A;   COLONOSCOPY  2020   DILATION AND CURETTAGE OF UTERUS     DOBUTAMINE STRESS ECHO     2013 normal   EYE SURGERY      childhood   OOPHORECTOMY  05/03/2016   Procedure: BILATERAL OOPHORECTOMY;  Surgeon: LELON Tanda Mulch, MD;  Location: [facility];  Service: Gynecology;;   Sleep study     2024   TRANSTHORACIC ECHOCARDIOGRAM     01/20/23 NORMAL    Family History  Problem Relation Age of Onset   Parkinson's disease Mother    Arthritis Mother    Heart disease Father    Arthritis Father    Diabetes Father    Heart attack Father    High Cholesterol Father    High blood pressure Father    Cancer Brother    Early death Brother    Heart disease Maternal Grandmother    Arthritis Maternal Grandmother    Heart attack Maternal Grandmother    Heart disease Maternal Grandfather    Early death Maternal Grandfather    Heart attack Maternal Grandfather    Mental illness Maternal Grandfather    Heart disease Paternal Grandmother    Ovarian cancer Paternal Grandmother    Arthritis Paternal Grandmother    Hearing loss Paternal Grandmother    Heart attack Paternal Grandmother    High blood pressure Paternal Grandmother    Mental illness Paternal Grandmother    Heart disease Paternal Grandfather    Alcohol abuse Paternal Grandfather    Early  death Paternal Grandfather    Heart attack Paternal Grandfather    High Cholesterol Paternal Grandfather     Social History   Socioeconomic History   Marital status: Married    Spouse name: Not on file   Number of children: 2   Years of education: Not on file   Highest education level: Not on file  Occupational History   Occupation: Magazine Features Editor: [employer]   Tobacco Use   Smoking status: Never   Smokeless tobacco: Never  Vaping Use   Vaping status: Never Used  Substance and Sexual Activity   Alcohol use: No   Drug use: No   Sexual activity: Not Currently  Other Topics Concern   Not on file  Social History Narrative   Married, 2 sons, 3 GC.   Educ: HS, 1 yr college   Occup: Architectural technologist at [employer]   Her mother has academic librarian and  lives with her.   No tob or alc   Social Drivers of Health   Tobacco Use: Low Risk (12/27/2024)   Patient History    Smoking Tobacco Use: Never    Smokeless Tobacco Use: Never    Passive Exposure: Not on file  Financial Resource Strain: Low Risk (12/19/2022)   Received from [facility]   Overall Financial Resource Strain (CARDIA)    Difficulty of Paying Living Expenses: Not hard at all  Food Insecurity: No Food Insecurity (12/19/2022)   Received from [facility]   Epic    Within the past 12 months, you worried that your food would run out before you got the money to buy more.: Never true    Within the past 12 months, the food you bought just didn't last and you didn't have money to get more.: Never true  Transportation Needs: No Transportation Needs (12/19/2022)   Received from [facility] - Transportation    Lack of Transportation (Medical): No    Lack of Transportation (Non-Medical): No  Physical Activity: Inactive (06/07/2022)   Received from [facility]   Exercise Vital Sign    On average, how many days per week do you engage in moderate to strenuous exercise (like a brisk walk)?: 0 days    On average, how many minutes do you engage in exercise at this level?: 0 min  Stress: Stress Concern Present (06/07/2022)   Received from [facility] - Occupational Stress Questionnaire    Feeling of Stress : To some extent  Social Connections: Socially Integrated (06/07/2022)   Received from [facility]   Social Network    How would you rate your social network (family, work, friends)?: Good participation with social networks  Intimate Partner Violence: Not At Risk (06/07/2022)   Received from [facility]   HITS    Over the last 12 months how often did your partner physically hurt you?: Never    Over the last 12 months how often did your partner insult you or talk down to you?: Rarely    Over the last 12 months how often did  your partner threaten you with physical harm?: Never    Over the last 12 months how often did your partner scream or curse at you?: Rarely  Depression (PHQ2-9): Low Risk (12/27/2024)   Depression (PHQ2-9)    PHQ-2 Score: 1  Alcohol Screen: Not on file  Housing: Not on file  Utilities: Not At Risk (12/19/2022)   Received from [facility]   AHC Utilities    Threatened with loss of utilities: No  Health Literacy: Not on file    Outpatient Medications Prior to Visit  Medication Sig Dispense Refill   aspirin EC 81 MG tablet Take 81 mg by mouth daily. Reported on 03/16/2016     Cetirizine HCl (ZYRTEC ALLERGY PO) Take 10 mg by mouth as needed.     Cholecalciferol (VITAMIN D PO) Take 2,000 Units by mouth daily. Reported on 03/16/2016     methocarbamol  (ROBAXIN ) 500 MG tablet Take 1 tablet (500 mg total) by mouth every 8 (eight) hours as needed for muscle spasms. 90 tablet 0   rosuvastatin  (CRESTOR ) 10 MG tablet 1 tab po every other day 45 tablet 3   fluticasone  (FLONASE  ALLERGY RELIEF) 50 MCG/ACT nasal spray Place 1 spray into both nostrils daily. Reported on 03/16/2016     lisinopril  (ZESTRIL ) 10 MG tablet Take 1 tablet (10 mg total) by mouth daily. 30 tablet 11   diclofenac  (VOLTAREN ) 75 MG EC tablet Take 1 tablet (75 mg total) by mouth 2 (two) times daily. (Patient not taking: Reported on 12/27/2024) 30 tablet 0   No facility-administered medications prior to visit.    Allergies[1]  Review of Systems  Constitutional:  Negative for appetite change, chills, fatigue and fever.  HENT:  Negative for congestion, dental problem, ear pain and sore throat.   Eyes:  Negative for discharge, redness and visual disturbance.  Respiratory:  Negative for cough, chest tightness, shortness of breath and wheezing.   Cardiovascular:  Negative for chest pain, palpitations and leg swelling.  Gastrointestinal:  Negative for abdominal pain, blood in stool, diarrhea, nausea and vomiting.  Genitourinary:  Negative  for difficulty urinating, dysuria, flank pain, frequency, hematuria and urgency.  Musculoskeletal:  Negative for arthralgias, back pain, joint swelling, myalgias and neck stiffness.  Skin:  Negative for pallor and rash.  Neurological:  Negative for dizziness, speech difficulty, weakness and headaches.  Hematological:  Negative for adenopathy. Does not bruise/bleed easily.  Psychiatric/Behavioral:  Negative for confusion and sleep disturbance. The patient is not nervous/anxious.     PE;    12/27/2024    1:45 PM 11/19/2024   11:36 AM 11/19/2024   10:00 AM  Vitals with BMI  Height 5' 6  5' 6  Weight 330 lbs 6 oz  328 lbs  BMI 53.35  52.97  Systolic  140 163  Diastolic  82 93  Pulse   72   Gen: Alert, well appearing.  Patient is oriented to person, place, time, and situation. AFFECT: pleasant, lucid thought and speech. ENT: Ears: EACs clear, normal epithelium.  TMs with good light reflex and landmarks bilaterally.  Eyes: no injection, icteris, swelling, or exudate.  EOMI, PERRLA. Nose: no drainage or turbinate edema/swelling.  No injection or focal lesion.  Mouth: lips without lesion/swelling.  Oral mucosa pink and moist.  Dentition intact and without obvious caries or gingival swelling.  Oropharynx without erythema, exudate, or swelling.  Neck: supple/nontender.  No LAD, mass, or TM.  Carotid pulses 2+ bilaterally, without bruits. CV: RRR, no m/r/g.   LUNGS: CTA bilat, nonlabored resps, good aeration in all lung fields. ABD: soft, NT, ND, BS normal.  No hepatospenomegaly or mass.  No bruits. EXT: no clubbing, cyanosis, or edema.  Musculoskeletal: no joint swelling, erythema, warmth, or tenderness.  ROM of all joints intact. Skin - no sores or suspicious lesions or rashes or color changes  Pertinent labs:   Lab Results  Component  Value Date   WBC 6.0 02/19/2019   HGB 11.7 (L) 02/19/2019   HCT 37.3 02/19/2019   MCV 91.4 02/19/2019   PLT 206 02/19/2019   Lab Results  Component  Value Date   CREATININE 0.66 06/19/2024   BUN 13 06/19/2024   NA 142 06/19/2024   K 4.2 06/19/2024   CL 103 06/19/2024   CO2 30 06/19/2024   Lab Results  Component Value Date   ALT 12 06/19/2024   AST 11 06/19/2024   ALKPHOS 92 06/19/2024   BILITOT 0.4 06/19/2024   ASSESSMENT AND PLAN:   #1 health maintenance exam: Reviewed age and gender appropriate health maintenance issues (prudent diet, regular exercise, health risks of tobacco and excessive alcohol, use of seatbelts, fire alarms in home, use of sunscreen).  Also reviewed age and gender appropriate health screening as well as vaccine recommendations. Vaccines: Tdap->declined.  Shingrix->declined.   Labs: HP labs ordered Cervical ca screening: hx hysterectomy, followed by GYN MD.  Breast ca screening: hx breast bx 2023 and 02/2024--->benign, gets approp f/u via GYN. Colon ca screening:  colonoscopy 2025 with polyps ([place]), recall 11/2028.  # 2 hypertension, well-controlled on lisinopril  10 mg a day. She will be getting electrolytes and creatinine monitoring soon with [facility].  3.  Hypercholesterolemia.  She has a little bit of statin induced myalgias but taking it every other day seems to be tolerable.  Continue rosuvastatin  10 mg every other day.  She will be getting lipid panel and hepatic panel soon with [facility] clinic.  An After Visit Summary was printed and given to the patient.  FOLLOW UP:  Return in about 6 months (around 06/26/2025) for routine chronic illness f/u.  Signed:  Gerlene Hockey, MD           12/27/2024     [1] No Known Allergies  "

## 2025-01-02 ENCOUNTER — Ambulatory Visit: Admitting: Bariatrics

## 2025-01-14 ENCOUNTER — Ambulatory Visit: Admitting: Bariatrics

## 2025-01-28 ENCOUNTER — Ambulatory Visit: Admitting: Bariatrics

## 2025-06-25 ENCOUNTER — Ambulatory Visit: Admitting: Family Medicine
